# Patient Record
Sex: Female | Born: 1990 | Race: Black or African American | Hispanic: No | Marital: Single | Smoking: Never smoker
Health system: Southern US, Community
[De-identification: ages and names within clinical notes are randomized; demographics above are authoritative.]

## PROBLEM LIST (undated history)

## (undated) ENCOUNTER — Inpatient Hospital Stay (HOSPITAL_COMMUNITY): Payer: Self-pay

## (undated) DIAGNOSIS — I10 Essential (primary) hypertension: Secondary | ICD-10-CM

## (undated) DIAGNOSIS — J45909 Unspecified asthma, uncomplicated: Secondary | ICD-10-CM

## (undated) HISTORY — DX: Essential (primary) hypertension: I10

## (undated) HISTORY — PX: NO PAST SURGERIES: SHX2092

## (undated) HISTORY — DX: Unspecified asthma, uncomplicated: J45.909

---

## 2011-01-27 ENCOUNTER — Emergency Department (HOSPITAL_COMMUNITY)
Admission: EM | Admit: 2011-01-27 | Discharge: 2011-01-27 | Disposition: A | Discharge status: Home / Self Care | Payer: Self-pay | Attending: Emergency Medicine | Admitting: Emergency Medicine

## 2011-01-27 DIAGNOSIS — K089 Disorder of teeth and supporting structures, unspecified: Secondary | ICD-10-CM | POA: Insufficient documentation

## 2011-01-27 DIAGNOSIS — K029 Dental caries, unspecified: Secondary | ICD-10-CM | POA: Insufficient documentation

## 2011-01-28 ENCOUNTER — Emergency Department (HOSPITAL_COMMUNITY)
Admission: EM | Admit: 2011-01-28 | Discharge: 2011-01-28 | Disposition: A | Discharge status: Home / Self Care | Payer: Self-pay | Attending: Emergency Medicine | Admitting: Emergency Medicine

## 2011-01-28 DIAGNOSIS — Y92009 Unspecified place in unspecified non-institutional (private) residence as the place of occurrence of the external cause: Secondary | ICD-10-CM | POA: Insufficient documentation

## 2011-01-28 DIAGNOSIS — M79609 Pain in unspecified limb: Secondary | ICD-10-CM | POA: Insufficient documentation

## 2011-01-28 DIAGNOSIS — S61409A Unspecified open wound of unspecified hand, initial encounter: Secondary | ICD-10-CM | POA: Insufficient documentation

## 2013-04-25 ENCOUNTER — Ambulatory Visit (INDEPENDENT_AMBULATORY_CARE_PROVIDER_SITE_OTHER): Payer: Medicaid Other

## 2013-04-25 ENCOUNTER — Ambulatory Visit (INDEPENDENT_AMBULATORY_CARE_PROVIDER_SITE_OTHER): Payer: Medicaid Other | Admitting: Obstetrics

## 2013-04-25 ENCOUNTER — Other Ambulatory Visit: Payer: Self-pay | Admitting: Obstetrics

## 2013-04-25 ENCOUNTER — Encounter: Payer: Self-pay | Admitting: Obstetrics

## 2013-04-25 VITALS — BP 128/82 | Temp 98.6°F | Wt 229.6 lb

## 2013-04-25 DIAGNOSIS — O30009 Twin pregnancy, unspecified number of placenta and unspecified number of amniotic sacs, unspecified trimester: Secondary | ICD-10-CM

## 2013-04-25 DIAGNOSIS — Z3400 Encounter for supervision of normal first pregnancy, unspecified trimester: Secondary | ICD-10-CM

## 2013-04-25 DIAGNOSIS — Z3402 Encounter for supervision of normal first pregnancy, second trimester: Secondary | ICD-10-CM

## 2013-04-25 DIAGNOSIS — Z369 Encounter for antenatal screening, unspecified: Secondary | ICD-10-CM

## 2013-04-25 DIAGNOSIS — O30002 Twin pregnancy, unspecified number of placenta and unspecified number of amniotic sacs, second trimester: Secondary | ICD-10-CM

## 2013-04-25 DIAGNOSIS — IMO0001 Reserved for inherently not codable concepts without codable children: Secondary | ICD-10-CM

## 2013-04-25 DIAGNOSIS — Z3689 Encounter for other specified antenatal screening: Secondary | ICD-10-CM

## 2013-04-25 DIAGNOSIS — O0990 Supervision of high risk pregnancy, unspecified, unspecified trimester: Secondary | ICD-10-CM

## 2013-04-25 DIAGNOSIS — Z3201 Encounter for pregnancy test, result positive: Secondary | ICD-10-CM

## 2013-04-25 DIAGNOSIS — Z1389 Encounter for screening for other disorder: Secondary | ICD-10-CM

## 2013-04-25 LAB — US OB DETAIL + 14 WK

## 2013-04-25 LAB — POCT URINALYSIS DIPSTICK
Bilirubin, UA: NEGATIVE
Glucose, UA: NEGATIVE
Ketones, UA: NEGATIVE
Leukocytes, UA: NEGATIVE
Nitrite, UA: NEGATIVE

## 2013-04-25 NOTE — Progress Notes (Signed)
P 108 Subjective:    Monique [name] is being seen today for her first obstetrical visit.  This is not a planned pregnancy. She is at [redacted]w[redacted]d gestation. Her obstetrical history is significant for obesity. Relationship with FOB: significant other, not living together. Patient unsure intend to breast feed. Pregnancy history fully reviewed.  Menstrual History: OB History   Grav Para Term Preterm Abortions TAB SAB Ect Mult Living   2    1           Menarche age: 22 Patient's last menstrual period was 11/21/2012.    The following portions of the patient's history were reviewed and updated as appropriate: allergies, current medications, past family history, past medical history, past social history, past surgical history and problem list.  Review of Systems Pertinent items are noted in HPI.    Objective:    General appearance: alert and no distress Abdomen: normal findings: soft, non-tender Pelvic: cervix normal in appearance, external genitalia normal, no adnexal masses or tenderness, no cervical motion tenderness, vagina normal without discharge and uterus enlarged, soft NT. Extremities: extremities normal, atraumatic, no cyanosis or edema    Assessment:    Pregnancy at 24 weeks.  Twins.   Plan:    Initial labs drawn. Prenatal vitamins.  Counseling provided regarding continued use of seat belts, cessation of alcohol consumption, smoking or use of illicit drugs; infection precautions i.e., influenza/TDAP immunizations, toxoplasmosis,CMV, parvovirus, listeria and varicella; workplace safety, exercise during pregnancy; routine dental care, safe medications, sexual activity, hot tubs, saunas, pools, travel, caffeine use, fish and methlymercury, potential toxins, hair treatments, varicose veins Weight gain recommendations reviewed: underweight/BMI< 18.5--> gain 28 - 40 lbs; normal weight/BMI 18.5 - 24.9--> gain 25 - 35 lbs; overweight/BMI 25 - 29.9--> gain 15 - 25 lbs; obese/BMI  >30->gain  11 - 20 lbs Problem list reviewed and updated. AFP3 discussed: requested. Role of ultrasound in pregnancy discussed; fetal survey: requested. Amniocentesis discussed: not indicated. Follow up in 2 weeks. 50% of 20 min visit spent on counseling and coordination of care.

## 2013-04-26 LAB — WET PREP BY MOLECULAR PROBE
Gardnerella vaginalis: NEGATIVE
Trichomonas vaginosis: NEGATIVE

## 2013-04-26 LAB — OBSTETRIC PANEL
Antibody Screen: NEGATIVE
Basophils Relative: 0 % (ref 0–1)
Eosinophils Absolute: 0 10*3/uL (ref 0.0–0.7)
Eosinophils Relative: 0 % (ref 0–5)
HCT: 35.2 % — ABNORMAL LOW (ref 36.0–46.0)
Hemoglobin: 11.9 g/dL — ABNORMAL LOW (ref 12.0–15.0)
Hepatitis B Surface Ag: NEGATIVE
MCH: 31.4 pg (ref 26.0–34.0)
MCHC: 33.8 g/dL (ref 30.0–36.0)
MCV: 92.9 fL (ref 78.0–100.0)
Monocytes Relative: 7 % (ref 3–12)
Neutrophils Relative %: 77 % (ref 43–77)
Platelets: 268 10*3/uL (ref 150–400)
RBC: 3.79 MIL/uL — ABNORMAL LOW (ref 3.87–5.11)
Rh Type: POSITIVE

## 2013-04-26 LAB — HIV ANTIBODY (ROUTINE TESTING W REFLEX): HIV: NONREACTIVE

## 2013-04-26 LAB — GC/CHLAMYDIA PROBE AMP: CT Probe RNA: NEGATIVE

## 2013-04-27 LAB — HEMOGLOBINOPATHY EVALUATION
Hemoglobin Other: 0 %
Hgb A2 Quant: 3 % (ref 2.2–3.2)
Hgb A: 96.8 % (ref 96.8–97.8)
Hgb F Quant: 0.2 % (ref 0.0–2.0)

## 2013-04-27 LAB — PAP IG, CT-NG, RFX HPV ASCU

## 2013-04-28 LAB — CULTURE, OB URINE

## 2013-05-09 ENCOUNTER — Ambulatory Visit (HOSPITAL_COMMUNITY)
Admission: RE | Admit: 2013-05-09 | Discharge: 2013-05-09 | Disposition: A | Discharge status: Home / Self Care | Payer: Medicaid Other | Source: Ambulatory Visit | Attending: Obstetrics | Admitting: Obstetrics

## 2013-05-09 ENCOUNTER — Encounter: Payer: Medicaid Other | Admitting: Obstetrics

## 2013-05-09 ENCOUNTER — Encounter: Payer: Self-pay | Admitting: Obstetrics

## 2013-05-09 ENCOUNTER — Ambulatory Visit (INDEPENDENT_AMBULATORY_CARE_PROVIDER_SITE_OTHER): Payer: Medicaid Other | Admitting: Obstetrics

## 2013-05-09 VITALS — BP 115/77 | Temp 98.1°F | Wt 231.0 lb

## 2013-05-09 DIAGNOSIS — Z1389 Encounter for screening for other disorder: Secondary | ICD-10-CM | POA: Insufficient documentation

## 2013-05-09 DIAGNOSIS — Z3400 Encounter for supervision of normal first pregnancy, unspecified trimester: Secondary | ICD-10-CM

## 2013-05-09 DIAGNOSIS — O358XX0 Maternal care for other (suspected) fetal abnormality and damage, not applicable or unspecified: Secondary | ICD-10-CM | POA: Insufficient documentation

## 2013-05-09 DIAGNOSIS — IMO0001 Reserved for inherently not codable concepts without codable children: Secondary | ICD-10-CM

## 2013-05-09 DIAGNOSIS — O0990 Supervision of high risk pregnancy, unspecified, unspecified trimester: Secondary | ICD-10-CM

## 2013-05-09 DIAGNOSIS — Z3401 Encounter for supervision of normal first pregnancy, first trimester: Secondary | ICD-10-CM

## 2013-05-09 DIAGNOSIS — Z363 Encounter for antenatal screening for malformations: Secondary | ICD-10-CM | POA: Insufficient documentation

## 2013-05-09 DIAGNOSIS — O30009 Twin pregnancy, unspecified number of placenta and unspecified number of amniotic sacs, unspecified trimester: Secondary | ICD-10-CM

## 2013-05-09 LAB — POCT URINALYSIS DIPSTICK
Bilirubin, UA: NEGATIVE
Ketones, UA: NEGATIVE
Nitrite, UA: NEGATIVE
Protein, UA: NEGATIVE
Spec Grav, UA: 1.015
pH, UA: 7

## 2013-05-09 MED ORDER — FOLIC ACID 1 MG PO TABS: 1.0000 mg | ORAL_TABLET | Freq: Every day | ORAL | Status: DC

## 2013-05-09 MED ORDER — VITAFOL-ONE 29-1-200 MG PO CAPS: 1.0000 | ORAL_CAPSULE | Freq: Every day | ORAL | Status: DC

## 2013-05-09 NOTE — Progress Notes (Signed)
HR - 82 Pt in office for routine OB visit, want to know if it is safe to sleep on her stomach.

## 2013-05-09 NOTE — Consult Note (Signed)
MFM Staff Consultation Note  By way of consultation, I briefly explained the biology of twinning, both monozygotic and dizygotic.  I described the sequence of development of the chorionic and amniotic membranes, and the clinical significance of chorionicity. I spoke to [name] about the risks and management of twin pregnancy.  I explained the risk of preterm labor and delivery, with the mean gestational age at delivery being around 35 weeks.  I also outlined the increased risk of abnormal fetal growth, especially IUGR, and especially in the third trimester.  I reviewed the increased risks of preeclampsia, gestational diabetes, and maternal anemia.  Because she has a dichorionic pregnancy, I did not discuss the risk of twin-twin transfusion syndrome as it does not apply.  I outlined the standard management plan for twin pregnancy with Ms.  I told her that she would likely be seen every two weeks in the office, with cervical exams only as indicated.  I also recommended repeat ultrasound exams every three to four weeks to plot fetal growth, position, and amniotic fluid volume.  Beginning at around 32-34 weeks, she should have twice weekly non-stress tests in addition to following fetal movement on a daily basis through the 3rd trimester. Because of the increased risk of GDM, an early glucola screen should be considered.  If normal, the test should be repeated at around 28 weeks.  At each of her office visits through the third trimester, the usual careful attention should be paid to blood pressure, proteinuria, weight gain, etc., as a screen for pre-eclampsia.  Assuming all goes well, she should plan for delivery by 38 weeks should spontaneous labor not occur in the interim.  Summary of Recommendations: 1. Interval growth monthly by ultrasound; 2. Initiation of antenatal testing around 32-34 weeks 3. Consideration of early 1 hour glucose tolerance test at earliest convenience.  If negative,  repeat at 28 weeks. 4. Assessment of cervical length by endovaginal imaging at around 24 weeks (concurrent with routinely scheduled growth scan) 5. Assuming all goes well, she should plan for delivery by 38 weeks should spontaneous labor not occur in the interim.   Time Spent:  I spent in excess of 30 minutes in consultation with this patient to review records, evaluate her case, and provide her with an adequate discussion and education.  More than 50% of this time was spent in direct face-to-face counseling. It was a pleasure seeing your patient in the office today.  Thank you for consultation. Please do not hesitate to contact our service for any further questions.   Thank you,  Monique [name], Monique Sjogren, MD, MS, FACOG Assistant Professor Section of Maternal-Fetal Medicine [facility]

## 2013-05-10 ENCOUNTER — Encounter: Payer: Medicaid Other | Admitting: Obstetrics

## 2013-05-25 ENCOUNTER — Inpatient Hospital Stay (HOSPITAL_COMMUNITY)
Admission: AD | Admit: 2013-05-25 | Discharge: 2013-05-26 | Disposition: A | Discharge status: Home / Self Care | Payer: Medicaid Other | Source: Ambulatory Visit | Attending: Obstetrics | Admitting: Obstetrics

## 2013-05-25 ENCOUNTER — Encounter: Payer: Medicaid Other | Admitting: Obstetrics

## 2013-05-25 ENCOUNTER — Encounter (HOSPITAL_COMMUNITY): Payer: Self-pay | Admitting: *Deleted

## 2013-05-25 DIAGNOSIS — B373 Candidiasis of vulva and vagina: Secondary | ICD-10-CM

## 2013-05-25 DIAGNOSIS — O4700 False labor before 37 completed weeks of gestation, unspecified trimester: Secondary | ICD-10-CM | POA: Insufficient documentation

## 2013-05-25 DIAGNOSIS — N949 Unspecified condition associated with female genital organs and menstrual cycle: Secondary | ICD-10-CM | POA: Insufficient documentation

## 2013-05-25 DIAGNOSIS — B3731 Acute candidiasis of vulva and vagina: Secondary | ICD-10-CM | POA: Insufficient documentation

## 2013-05-25 DIAGNOSIS — O0990 Supervision of high risk pregnancy, unspecified, unspecified trimester: Secondary | ICD-10-CM | POA: Insufficient documentation

## 2013-05-25 DIAGNOSIS — O2390 Unspecified genitourinary tract infection in pregnancy, unspecified trimester: Secondary | ICD-10-CM | POA: Insufficient documentation

## 2013-05-25 DIAGNOSIS — Z87891 Personal history of nicotine dependence: Secondary | ICD-10-CM | POA: Insufficient documentation

## 2013-05-25 LAB — OB RESULTS CONSOLE GC/CHLAMYDIA
CHLAMYDIA, DNA PROBE: NEGATIVE
Gonorrhea: NEGATIVE

## 2013-05-25 NOTE — MAU Note (Signed)
PT SAYS  SHE STARTED FEELING PAIN IN HER VAGINA- HURTS WHEN SHE PUTS HER PANTS ON.   SAW DR HARPER- 1.5 WK AGO. - ALL OK .  SHE CALLED [facility]- TOLD HER TO CALL OFFICE- NO ANSWER  ON PHONE.Marland Kitchen  NEXT APPOINTMENT- 2 WEEKS.    LAST SEX-MON OR Tuesday -  NO PAIN- BUT DID FEEL WEIRD  TO VOID.

## 2013-05-26 DIAGNOSIS — B373 Candidiasis of vulva and vagina: Secondary | ICD-10-CM

## 2013-05-26 LAB — GC/CHLAMYDIA PROBE AMP
CT Probe RNA: NEGATIVE
GC Probe RNA: NEGATIVE

## 2013-05-26 LAB — URINE MICROSCOPIC-ADD ON

## 2013-05-26 LAB — URINALYSIS, ROUTINE W REFLEX MICROSCOPIC
Bilirubin Urine: NEGATIVE
Ketones, ur: NEGATIVE mg/dL
Nitrite: NEGATIVE
Specific Gravity, Urine: 1.02 (ref 1.005–1.030)
Urobilinogen, UA: 1 mg/dL (ref 0.0–1.0)
pH: 7 (ref 5.0–8.0)

## 2013-05-26 LAB — WET PREP, GENITAL: Trich, Wet Prep: NONE SEEN

## 2013-05-26 MED ORDER — FLUCONAZOLE 150 MG PO TABS
150.0000 mg | ORAL_TABLET | Freq: Once | ORAL | Status: AC
  Administered 2013-05-26: 150 mg via ORAL
  Filled 2013-05-26: qty 1

## 2013-05-26 NOTE — MAU Provider Note (Signed)
History     CSN: [id number]  Arrival date and time: 05/25/13 2303   First Provider Initiated Contact with Patient 05/26/13 0007      No chief complaint on file.  HPI  Monique [name] is a 22 y.o. G2P0010 at 102w1d with twins. She presents tonight with pelvic pain. She states that when she is resting or sitting she has no pain, but when she puts her pants on she feels "really bad" pain in her pelvis. She denies any UCs, VB or LOF and confirms both fetuses are moving. She denies any problems with this pregnancy. She has an appointment with Dr. Clearance Coots next week.   Past Medical History  Diagnosis Date  . Asthma     History reviewed. No pertinent past surgical history.  Family History  Problem Relation Age of Onset  . Hypertension Mother   . Diabetes Mother   . Hypertension Father   . Diabetes Father     History  Substance Use Topics  . Smoking status: Former Smoker    Types: Cigars  . Smokeless tobacco: Not on file  . Alcohol Use: No    Allergies: No Known Allergies  Prescriptions prior to admission  Medication Sig Dispense Refill  . Prenatal Vit-FePoly-FA-DHA (VITAFOL-ONE) 29-1-200 MG CAPS Take 1 capsule by mouth daily before breakfast.  90 capsule  3  . folic acid (FOLVITE) 1 MG tablet Take 1 tablet (1 mg total) by mouth daily.  90 tablet  3  . prenatal vitamin w/FE, FA (PRENATAL 1 + 1) 27-1 MG TABS tablet Take 1 tablet by mouth daily at 12 noon.        ROS Physical Exam   Blood pressure 137/74, pulse 89, temperature 98.1 F (36.7 C), temperature source Oral, resp. rate 20, height 5\' 3"  (1.6 m), weight 109.43 kg (241 lb 4 oz), last menstrual period 11/21/2012.  Physical Exam  Nursing note and vitals reviewed. Constitutional: She is oriented to person, place, and time. She appears well-developed and well-nourished. No distress.  Cardiovascular: Normal rate.   Respiratory: Effort normal.  GI: Soft. There is no tenderness.  Genitourinary:   .External: no  lesion Vagina: small amount of thick white discharge Cervix: pink, smooth, closed/thick/high Uterus: AGA    Neurological: She is alert and oriented to person, place, and time.  Skin: Skin is warm and dry.  Psychiatric: She has a normal mood and affect.   FHT: A: 140, moderate with 10x10 accels no decels B: 145, moderate with 10x10 accels, no decels Appropriate for GA Toco: No UCs MAU Course  Procedures  Results for orders placed during the hospital encounter of 05/25/13 (from the past 24 hour(s))  URINALYSIS, ROUTINE W REFLEX MICROSCOPIC     Status: Abnormal   Collection Time    05/25/13 11:12 PM      Result Value Range   Color, Urine YELLOW  YELLOW   APPearance HAZY (*) CLEAR   Specific Gravity, Urine 1.020  1.005 - 1.030   pH 7.0  5.0 - 8.0   Glucose, UA NEGATIVE  NEGATIVE mg/dL   Hgb urine dipstick TRACE (*) NEGATIVE   Bilirubin Urine NEGATIVE  NEGATIVE   Ketones, ur NEGATIVE  NEGATIVE mg/dL   Protein, ur NEGATIVE  NEGATIVE mg/dL   Urobilinogen, UA 1.0  0.0 - 1.0 mg/dL   Nitrite NEGATIVE  NEGATIVE   Leukocytes, UA TRACE (*) NEGATIVE  URINE MICROSCOPIC-ADD ON     Status: None   Collection Time    05/25/13 11:12 PM  Result Value Range   Squamous Epithelial / LPF RARE  RARE   WBC, UA 0-2  <3 WBC/hpf   RBC / HPF 0-2  <3 RBC/hpf   Bacteria, UA RARE  RARE   Urine-Other YEAST    WET PREP, GENITAL     Status: Abnormal   Collection Time    05/25/13 11:55 PM      Result Value Range   Yeast Wet Prep HPF POC FEW (*) NONE SEEN   Trich, Wet Prep NONE SEEN  NONE SEEN   Clue Cells Wet Prep HPF POC MODERATE (*) NONE SEEN   WBC, Wet Prep HPF POC MODERATE (*) NONE SEEN     Assessment and Plan   1. Yeast infection involving the vagina and surrounding area   2. 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 Unspecified high-risk pregnancy    PTL precautions Comfort measures for pubic bone pain reviewed Maternity support belt  recommended  Follow-up Information   Follow up with HARPER,CHARLES A, MD. (as planned )    Specialty:  Obstetrics and Gynecology   Contact information:   [address] [place] [address] [phone]        Tawnya Crook 05/26/2013, 12:15 AM

## 2013-06-05 ENCOUNTER — Other Ambulatory Visit: Payer: Self-pay | Admitting: Obstetrics

## 2013-06-05 DIAGNOSIS — O30009 Twin pregnancy, unspecified number of placenta and unspecified number of amniotic sacs, unspecified trimester: Secondary | ICD-10-CM

## 2013-06-06 ENCOUNTER — Ambulatory Visit (HOSPITAL_COMMUNITY)
Admission: RE | Admit: 2013-06-06 | Discharge: 2013-06-06 | Disposition: A | Discharge status: Home / Self Care | Payer: Medicaid Other | Source: Ambulatory Visit | Attending: Obstetrics | Admitting: Obstetrics

## 2013-06-15 ENCOUNTER — Ambulatory Visit (HOSPITAL_COMMUNITY)
Admission: RE | Admit: 2013-06-15 | Discharge: 2013-06-15 | Disposition: A | Discharge status: Home / Self Care | Payer: Medicaid Other | Source: Ambulatory Visit | Attending: Obstetrics | Admitting: Obstetrics

## 2013-06-15 ENCOUNTER — Other Ambulatory Visit: Payer: Medicaid Other

## 2013-06-15 ENCOUNTER — Ambulatory Visit (INDEPENDENT_AMBULATORY_CARE_PROVIDER_SITE_OTHER): Payer: Medicaid Other | Admitting: Obstetrics

## 2013-06-15 VITALS — BP 125/77 | Temp 97.8°F | Wt 249.0 lb

## 2013-06-15 DIAGNOSIS — O30009 Twin pregnancy, unspecified number of placenta and unspecified number of amniotic sacs, unspecified trimester: Secondary | ICD-10-CM

## 2013-06-15 DIAGNOSIS — Z3480 Encounter for supervision of other normal pregnancy, unspecified trimester: Secondary | ICD-10-CM

## 2013-06-15 DIAGNOSIS — Z3482 Encounter for supervision of other normal pregnancy, second trimester: Secondary | ICD-10-CM

## 2013-06-15 DIAGNOSIS — O30049 Twin pregnancy, dichorionic/diamniotic, unspecified trimester: Secondary | ICD-10-CM | POA: Insufficient documentation

## 2013-06-15 DIAGNOSIS — O3660X0 Maternal care for excessive fetal growth, unspecified trimester, not applicable or unspecified: Secondary | ICD-10-CM | POA: Insufficient documentation

## 2013-06-15 LAB — POCT URINALYSIS DIPSTICK
Bilirubin, UA: NEGATIVE
Blood, UA: NEGATIVE
Spec Grav, UA: 1.01
Urobilinogen, UA: NEGATIVE
pH, UA: 7

## 2013-06-15 NOTE — Progress Notes (Signed)
Pulse- 87 

## 2013-06-16 ENCOUNTER — Encounter: Payer: Self-pay | Admitting: Obstetrics

## 2013-06-19 ENCOUNTER — Ambulatory Visit (INDEPENDENT_AMBULATORY_CARE_PROVIDER_SITE_OTHER): Payer: Medicaid Other | Admitting: Obstetrics

## 2013-06-19 ENCOUNTER — Other Ambulatory Visit: Payer: Medicaid Other

## 2013-06-19 ENCOUNTER — Encounter: Payer: Self-pay | Admitting: Obstetrics

## 2013-06-19 VITALS — BP 149/84 | Temp 97.9°F | Wt 243.0 lb

## 2013-06-19 DIAGNOSIS — Z3402 Encounter for supervision of normal first pregnancy, second trimester: Secondary | ICD-10-CM

## 2013-06-19 DIAGNOSIS — O30009 Twin pregnancy, unspecified number of placenta and unspecified number of amniotic sacs, unspecified trimester: Secondary | ICD-10-CM

## 2013-06-19 DIAGNOSIS — O30002 Twin pregnancy, unspecified number of placenta and unspecified number of amniotic sacs, second trimester: Secondary | ICD-10-CM

## 2013-06-19 LAB — POCT URINALYSIS DIPSTICK
Blood, UA: NEGATIVE
Protein, UA: NEGATIVE
Spec Grav, UA: 1.01
pH, UA: 7

## 2013-06-19 LAB — CBC
HCT: 32.3 % — ABNORMAL LOW (ref 36.0–46.0)
Hemoglobin: 11 g/dL — ABNORMAL LOW (ref 12.0–15.0)
MCV: 86.1 fL (ref 78.0–100.0)
Platelets: 258 10*3/uL (ref 150–400)
RBC: 3.75 MIL/uL — ABNORMAL LOW (ref 3.87–5.11)
WBC: 8.4 10*3/uL (ref 4.0–10.5)

## 2013-06-19 NOTE — Progress Notes (Signed)
Pulse- 101 

## 2013-06-20 ENCOUNTER — Encounter: Payer: Self-pay | Admitting: Obstetrics

## 2013-06-20 LAB — HIV ANTIBODY (ROUTINE TESTING W REFLEX): HIV: NONREACTIVE

## 2013-06-20 LAB — GLUCOSE TOLERANCE, 2 HOURS W/ 1HR: Glucose, Fasting: 63 mg/dL — ABNORMAL LOW (ref 70–99)

## 2013-07-04 ENCOUNTER — Encounter: Payer: Medicaid Other | Admitting: Obstetrics

## 2013-07-05 ENCOUNTER — Other Ambulatory Visit: Payer: Self-pay | Admitting: Obstetrics

## 2013-07-05 DIAGNOSIS — O30009 Twin pregnancy, unspecified number of placenta and unspecified number of amniotic sacs, unspecified trimester: Secondary | ICD-10-CM

## 2013-07-07 ENCOUNTER — Other Ambulatory Visit: Payer: Self-pay | Admitting: Obstetrics

## 2013-07-07 ENCOUNTER — Encounter (HOSPITAL_COMMUNITY): Payer: Self-pay

## 2013-07-07 ENCOUNTER — Ambulatory Visit (HOSPITAL_COMMUNITY)
Admission: RE | Admit: 2013-07-07 | Discharge: 2013-07-07 | Disposition: A | Discharge status: Home / Self Care | Payer: Medicaid Other | Source: Ambulatory Visit | Attending: Obstetrics | Admitting: Obstetrics

## 2013-07-07 DIAGNOSIS — O30049 Twin pregnancy, dichorionic/diamniotic, unspecified trimester: Secondary | ICD-10-CM | POA: Insufficient documentation

## 2013-07-07 DIAGNOSIS — O99210 Obesity complicating pregnancy, unspecified trimester: Principal | ICD-10-CM

## 2013-07-07 DIAGNOSIS — O30009 Twin pregnancy, unspecified number of placenta and unspecified number of amniotic sacs, unspecified trimester: Secondary | ICD-10-CM

## 2013-07-07 DIAGNOSIS — E669 Obesity, unspecified: Secondary | ICD-10-CM | POA: Insufficient documentation

## 2013-07-10 ENCOUNTER — Ambulatory Visit (INDEPENDENT_AMBULATORY_CARE_PROVIDER_SITE_OTHER): Payer: Medicaid Other | Admitting: Obstetrics

## 2013-07-10 ENCOUNTER — Encounter: Payer: Self-pay | Admitting: Obstetrics

## 2013-07-10 VITALS — BP 136/79 | Temp 97.6°F | Wt 244.0 lb

## 2013-07-10 DIAGNOSIS — Z3403 Encounter for supervision of normal first pregnancy, third trimester: Secondary | ICD-10-CM

## 2013-07-10 DIAGNOSIS — Z3400 Encounter for supervision of normal first pregnancy, unspecified trimester: Secondary | ICD-10-CM

## 2013-07-10 LAB — POCT URINALYSIS DIPSTICK
BILIRUBIN UA: NEGATIVE
Blood, UA: NEGATIVE
GLUCOSE UA: NEGATIVE
Leukocytes, UA: NEGATIVE
NITRITE UA: NEGATIVE
PH UA: 7
Protein, UA: NEGATIVE
SPEC GRAV UA: 1.01
UROBILINOGEN UA: 1

## 2013-07-10 NOTE — Progress Notes (Signed)
P = 98 

## 2013-07-25 ENCOUNTER — Other Ambulatory Visit: Payer: Self-pay | Admitting: *Deleted

## 2013-07-25 DIAGNOSIS — O30009 Twin pregnancy, unspecified number of placenta and unspecified number of amniotic sacs, unspecified trimester: Secondary | ICD-10-CM

## 2013-07-25 MED ORDER — FOLIC ACID 1 MG PO TABS: 1.0000 mg | ORAL_TABLET | Freq: Every day | ORAL | Status: DC

## 2013-07-25 MED ORDER — VITAFOL-ONE 29-1-200 MG PO CAPS: 1.0000 | ORAL_CAPSULE | Freq: Every day | ORAL | Status: DC

## 2013-07-26 ENCOUNTER — Other Ambulatory Visit: Payer: Self-pay | Admitting: Obstetrics

## 2013-07-26 DIAGNOSIS — O30009 Twin pregnancy, unspecified number of placenta and unspecified number of amniotic sacs, unspecified trimester: Secondary | ICD-10-CM

## 2013-07-28 ENCOUNTER — Ambulatory Visit (HOSPITAL_COMMUNITY)
Admission: RE | Admit: 2013-07-28 | Discharge: 2013-07-28 | Disposition: A | Discharge status: Home / Self Care | Payer: Medicaid Other | Source: Ambulatory Visit | Attending: Advanced Practice Midwife | Admitting: Advanced Practice Midwife

## 2013-07-28 ENCOUNTER — Ambulatory Visit (INDEPENDENT_AMBULATORY_CARE_PROVIDER_SITE_OTHER): Payer: Medicaid Other | Admitting: Advanced Practice Midwife

## 2013-07-28 ENCOUNTER — Ambulatory Visit (HOSPITAL_COMMUNITY)
Admission: RE | Admit: 2013-07-28 | Discharge: 2013-07-28 | Disposition: A | Discharge status: Home / Self Care | Payer: Medicaid Other | Source: Ambulatory Visit | Attending: Obstetrics | Admitting: Obstetrics

## 2013-07-28 VITALS — BP 126/93 | Temp 97.7°F | Wt 247.0 lb

## 2013-07-28 DIAGNOSIS — O99891 Other specified diseases and conditions complicating pregnancy: Secondary | ICD-10-CM | POA: Insufficient documentation

## 2013-07-28 DIAGNOSIS — Z3480 Encounter for supervision of other normal pregnancy, unspecified trimester: Secondary | ICD-10-CM

## 2013-07-28 DIAGNOSIS — J45909 Unspecified asthma, uncomplicated: Secondary | ICD-10-CM | POA: Insufficient documentation

## 2013-07-28 DIAGNOSIS — O30009 Twin pregnancy, unspecified number of placenta and unspecified number of amniotic sacs, unspecified trimester: Secondary | ICD-10-CM | POA: Insufficient documentation

## 2013-07-28 DIAGNOSIS — O9989 Other specified diseases and conditions complicating pregnancy, childbirth and the puerperium: Secondary | ICD-10-CM

## 2013-07-28 DIAGNOSIS — O30049 Twin pregnancy, dichorionic/diamniotic, unspecified trimester: Secondary | ICD-10-CM | POA: Insufficient documentation

## 2013-07-28 DIAGNOSIS — E669 Obesity, unspecified: Secondary | ICD-10-CM | POA: Insufficient documentation

## 2013-07-28 DIAGNOSIS — O99210 Obesity complicating pregnancy, unspecified trimester: Secondary | ICD-10-CM

## 2013-07-28 LAB — POCT URINALYSIS DIPSTICK
Bilirubin, UA: NEGATIVE
GLUCOSE UA: NEGATIVE
Ketones, UA: NEGATIVE
Leukocytes, UA: NEGATIVE
Nitrite, UA: NEGATIVE
RBC UA: NEGATIVE
SPEC GRAV UA: 1.015
UROBILINOGEN UA: NEGATIVE
pH, UA: 6.5

## 2013-07-28 NOTE — Progress Notes (Signed)
Pulse: 91 Patient states she has so pelvic pressure [facility] and [facility] told her it was normal.

## 2013-07-28 NOTE — Progress Notes (Signed)
Subjective: Monique [name] is a 23 y.o. at 32 weeks  Patient denies vaginal leaking of fluid or bleeding, denies contractions.  Reports positive fetal movment.  Denies concerns today. Had earlier US today and NST. Reports everything went well. Denies HA, RUQ pain and vision change.  Objective: Filed Vitals:   07/28/13 1036  BP: 126/93  Temp: 97.7 F (36.5 C)   140 FHR A 146 FHR B Fetal movement.    Assessment: Patient Active Problem List   Diagnosis Date Noted  . Unspecified high-risk pregnancy 04/25/2013    Plan: Patient to return to clinic in 1 week RTC to see Dr. Clearance CootsHarper next week.  Establish monitoring after 32 weeks, may need NST if biweekly. Patient has US and NST scheduled next Friday. Monitor BP closely, unable to repeat BP today. Patient asymptomatic today w/ trace protein. Reviewed warning signs in pregnancy. Patient to call with concerns PRN. Reviewed triage location.  20 min spent with patient greater than 80% spent in counseling and coordination of care.   Jessa Stinson Wilson SingerWren CNM

## 2013-08-01 ENCOUNTER — Ambulatory Visit (HOSPITAL_COMMUNITY): Payer: Medicaid Other

## 2013-08-01 ENCOUNTER — Ambulatory Visit (INDEPENDENT_AMBULATORY_CARE_PROVIDER_SITE_OTHER): Payer: Medicaid Other | Admitting: Obstetrics

## 2013-08-01 ENCOUNTER — Encounter: Payer: Self-pay | Admitting: Obstetrics

## 2013-08-01 VITALS — BP 137/95 | Temp 97.7°F | Wt 252.0 lb

## 2013-08-01 DIAGNOSIS — O30009 Twin pregnancy, unspecified number of placenta and unspecified number of amniotic sacs, unspecified trimester: Secondary | ICD-10-CM

## 2013-08-01 DIAGNOSIS — Z3480 Encounter for supervision of other normal pregnancy, unspecified trimester: Secondary | ICD-10-CM

## 2013-08-01 LAB — POCT URINALYSIS DIPSTICK
Bilirubin, UA: NEGATIVE
Blood, UA: NEGATIVE
GLUCOSE UA: NEGATIVE
KETONES UA: NEGATIVE
LEUKOCYTES UA: NEGATIVE
Nitrite, UA: NEGATIVE
Protein, UA: NEGATIVE
Urobilinogen, UA: NEGATIVE
pH, UA: 7

## 2013-08-01 NOTE — Progress Notes (Signed)
Pulse: 89 Patient states there are no concerns.

## 2013-08-02 ENCOUNTER — Other Ambulatory Visit: Payer: Self-pay | Admitting: Obstetrics

## 2013-08-02 DIAGNOSIS — O30009 Twin pregnancy, unspecified number of placenta and unspecified number of amniotic sacs, unspecified trimester: Secondary | ICD-10-CM

## 2013-08-04 ENCOUNTER — Ambulatory Visit (HOSPITAL_COMMUNITY)
Admission: RE | Admit: 2013-08-04 | Discharge: 2013-08-04 | Disposition: A | Discharge status: Home / Self Care | Payer: Medicaid Other | Source: Ambulatory Visit | Attending: Advanced Practice Midwife | Admitting: Advanced Practice Midwife

## 2013-08-04 ENCOUNTER — Ambulatory Visit (HOSPITAL_COMMUNITY): Payer: Medicaid Other

## 2013-08-04 DIAGNOSIS — O30049 Twin pregnancy, dichorionic/diamniotic, unspecified trimester: Secondary | ICD-10-CM | POA: Insufficient documentation

## 2013-08-04 DIAGNOSIS — O30009 Twin pregnancy, unspecified number of placenta and unspecified number of amniotic sacs, unspecified trimester: Secondary | ICD-10-CM | POA: Insufficient documentation

## 2013-08-08 ENCOUNTER — Encounter: Payer: Self-pay | Admitting: Obstetrics

## 2013-08-08 ENCOUNTER — Ambulatory Visit (INDEPENDENT_AMBULATORY_CARE_PROVIDER_SITE_OTHER): Payer: Medicaid Other | Admitting: Obstetrics

## 2013-08-08 VITALS — BP 153/92 | Temp 97.9°F | Wt 252.0 lb

## 2013-08-08 DIAGNOSIS — Z3400 Encounter for supervision of normal first pregnancy, unspecified trimester: Secondary | ICD-10-CM

## 2013-08-08 LAB — POCT URINALYSIS DIPSTICK
Bilirubin, UA: NEGATIVE
Glucose, UA: NEGATIVE
Ketones, UA: NEGATIVE
Nitrite, UA: NEGATIVE
PH UA: 7
Spec Grav, UA: <=1.005
Urobilinogen, UA: NEGATIVE

## 2013-08-08 NOTE — Progress Notes (Signed)
Pulse: 86 Patient denies any concerns.

## 2013-08-12 ENCOUNTER — Encounter (HOSPITAL_COMMUNITY): Payer: Self-pay | Admitting: *Deleted

## 2013-08-12 ENCOUNTER — Inpatient Hospital Stay (HOSPITAL_COMMUNITY)
Admission: AD | Admit: 2013-08-12 | Discharge: 2013-08-12 | Disposition: A | Discharge status: Home / Self Care | Payer: Medicaid Other | Source: Ambulatory Visit | Attending: Obstetrics | Admitting: Obstetrics

## 2013-08-12 DIAGNOSIS — O99891 Other specified diseases and conditions complicating pregnancy: Secondary | ICD-10-CM | POA: Insufficient documentation

## 2013-08-12 DIAGNOSIS — O9989 Other specified diseases and conditions complicating pregnancy, childbirth and the puerperium: Principal | ICD-10-CM

## 2013-08-12 DIAGNOSIS — N949 Unspecified condition associated with female genital organs and menstrual cycle: Secondary | ICD-10-CM

## 2013-08-12 DIAGNOSIS — K219 Gastro-esophageal reflux disease without esophagitis: Secondary | ICD-10-CM

## 2013-08-12 LAB — URINE MICROSCOPIC-ADD ON

## 2013-08-12 LAB — URINALYSIS, ROUTINE W REFLEX MICROSCOPIC
BILIRUBIN URINE: NEGATIVE
Glucose, UA: NEGATIVE mg/dL
KETONES UR: 15 mg/dL — AB
Leukocytes, UA: NEGATIVE
NITRITE: NEGATIVE
Protein, ur: 100 mg/dL — AB
SPECIFIC GRAVITY, URINE: 1.01 (ref 1.005–1.030)
UROBILINOGEN UA: 0.2 mg/dL (ref 0.0–1.0)
pH: 6 (ref 5.0–8.0)

## 2013-08-12 MED ORDER — RANITIDINE HCL 150 MG PO TABS: 150.0000 mg | ORAL_TABLET | Freq: Two times a day (BID) | ORAL | Status: DC

## 2013-08-12 NOTE — MAU Provider Note (Signed)
Chief Complaint:  Abdominal Pain   First Provider Initiated Contact with Patient 08/12/13 2108      HPI: Monique [name] is a 23 y.o. G2P0010 at 817w3d who presents to maternity admissions reporting sharp bilateral inguinal pain which is intermittent, usually short, but sometimes lasting 30 minutes and tenderness at her umbilicus.  The lower abdominal pain has been occurring at night x3 nights and the abdominal tenderness x1-2 weeks.  She reports good fetal movement, denies LOF, vaginal bleeding, vaginal itching/burning, urinary symptoms, h/a, dizziness, n/v, or fever/chills.   Past Medical History: Past Medical History  Diagnosis Date  . Asthma     Past obstetric history: OB History  Gravida Para Term Preterm AB SAB TAB Ectopic Multiple Living  2    1     0    # Outcome Date GA Lbr Len/2nd Weight Sex Delivery Anes PTL Lv  2 CUR           1 ABT 09/26/12              Past Surgical History: Past Surgical History  Procedure Laterality Date  . No past surgeries      Family History: Family History  Problem Relation Age of Onset  . Hypertension Mother   . Diabetes Mother   . Hypertension Father   . Diabetes Father     Social History: History  Substance Use Topics  . Smoking status: Former Smoker    Types: Cigars  . Smokeless tobacco: Not on file  . Alcohol Use: No    Allergies: No Known Allergies  Meds:  Prescriptions prior to admission  Medication Sig Dispense Refill  . folic acid (FOLVITE) 1 MG tablet Take 1 tablet (1 mg total) by mouth daily.  90 tablet  3  . Prenatal Vit-FePoly-FA-DHA (VITAFOL-ONE) 29-1-200 MG CAPS Take 1 capsule by mouth daily before breakfast.  90 capsule  3    ROS: Pertinent findings in history of present illness.  Physical Exam  Blood pressure 136/70, pulse 85, temperature 98.7 F (37.1 C), temperature source Oral, resp. rate 18, height 5\' 3"  (1.6 m), weight 113.218 kg (249 lb 9.6 oz), last menstrual period 11/21/2012. GENERAL:  Well-developed, well-nourished female in no acute distress.  HEENT: normocephalic HEART: normal rate RESP: normal effort ABDOMEN: Soft, non-tender, gravid appropriate for gestational age EXTREMITIES: Nontender, no edema NEURO: alert and oriented  Dilation: Closed Effacement (%): Thick Cervical Position: Posterior Station: -3 Exam by:: L.Leftwich-Kirby,CNM  FHT Baby  A:  Baseline 145, moderate variability, accelerations present, no decelerations FHT Baby B:  Baseline 145 , moderate variability, accelerations present, no decelerations Contractions: None on toco or to palpation   Labs: Results for orders placed during the hospital encounter of 08/12/13 (from the past 24 hour(s))  URINALYSIS, ROUTINE W REFLEX MICROSCOPIC     Status: Abnormal   Collection Time    08/12/13  8:23 PM      Result Value Range   Color, Urine YELLOW  YELLOW   APPearance CLEAR  CLEAR   Specific Gravity, Urine 1.010  1.005 - 1.030   pH 6.0  5.0 - 8.0   Glucose, UA NEGATIVE  NEGATIVE mg/dL   Hgb urine dipstick MODERATE (*) NEGATIVE   Bilirubin Urine NEGATIVE  NEGATIVE   Ketones, ur 15 (*) NEGATIVE mg/dL   Protein, ur 161100 (*) NEGATIVE mg/dL   Urobilinogen, UA 0.2  0.0 - 1.0 mg/dL   Nitrite NEGATIVE  NEGATIVE   Leukocytes, UA NEGATIVE  NEGATIVE  URINE MICROSCOPIC-ADD  ON     Status: Abnormal   Collection Time    08/12/13  8:23 PM      Result Value Range   Squamous Epithelial / LPF MANY (*) RARE   WBC, UA 3-6  <3 WBC/hpf   RBC / HPF 0-2  <3 RBC/hpf   Bacteria, UA MANY (*) RARE    Assessment: 1. Round ligament pain     Plan: Consult Dr Tamela Oddi Discharge home Labor precautions and fetal kick counts Rest, warm bath, heat, Tylenol for round ligament pain Increase PO fluids Keep scheduled appointments in office Return to MAU as needed      Follow-up Information   Follow up with HARPER,CHARLES A, MD. (As scheduled. Return to MAU as needed.)    Specialty:  Obstetrics and Gynecology    Contact information:   [address] [place] [address] [phone]        Medication List         folic acid 1 MG tablet  Commonly known as:  FOLVITE  Take 1 tablet (1 mg total) by mouth daily.     VITAFOL-ONE 29-1-200 MG Caps  Take 1 capsule by mouth daily before breakfast.        Sharen Counter Certified Nurse-Midwife 08/12/2013 9:57 PM  Addendum:  At discharge, pt reported feeling heartburn frequently, causing her to vomit 2-4x/day.  She is not taking anything for heartburn currently.  Zantac 150 mg PO BID sent to pt pharmacy.

## 2013-08-12 NOTE — MAU Note (Signed)
Pt reports she has been having sharp abd pain in her lower abd off and on for the past 3 nights. Also c/o  umilical pain that hurts when it is touched or rubbed against (by clothing). Reports she has been vomiting for the past2 days only able to keep down liquids. Vomits food up (fries , cheese steak and gronola bar)

## 2013-08-12 NOTE — Discharge Instructions (Signed)
Reasons to return to MAU:  1.  Contractions are  5-10 minutes apart or less, each last 1 minute, and you cannot walk or talk during them 2.  You have a large gush of fluid, or a trickle of fluid that will not stop and you have to wear a pad 3.  You have bleeding that is bright red, heavier than spotting--like menstrual bleeding (spotting can be normal in early labor or after a check of your cervix) 4.  You do not feel the baby moving like he/she normally does  Abdominal Pain During Pregnancy Abdominal pain is common in pregnancy. Most of the time, it does not cause harm. There are many causes of abdominal pain. Some causes are more serious than others. Some of the causes of abdominal pain in pregnancy are easily diagnosed. Occasionally, the diagnosis takes time to understand. Other times, the cause is not determined. Abdominal pain can be a sign that something is very wrong with the pregnancy, or the pain may have nothing to do with the pregnancy at all. For this reason, always tell your health care provider if you have any abdominal discomfort. HOME CARE INSTRUCTIONS  Monitor your abdominal pain for any changes. The following actions may help to alleviate any discomfort you are experiencing:  Do not have sexual intercourse or put anything in your vagina until your symptoms go away completely.  Get plenty of rest until your pain improves.  Drink clear fluids if you feel nauseous. Avoid solid food as long as you are uncomfortable or nauseous.  Only take over-the-counter or prescription medicine as directed by your health care provider.  Keep all follow-up appointments with your health care provider. SEEK IMMEDIATE MEDICAL CARE IF:  You are bleeding, leaking fluid, or passing tissue from the vagina.  You have increasing pain or cramping.  You have persistent vomiting.  You have painful or bloody urination.  You have a fever.  You notice a decrease in your baby's movements.  You have  extreme weakness or feel faint.  You have shortness of breath, with or without abdominal pain.  You develop a severe headache with abdominal pain.  You have abnormal vaginal discharge with abdominal pain.  You have persistent diarrhea.  You have abdominal pain that continues even after rest, or gets worse. MAKE SURE YOU:   Understand these instructions.  Will watch your condition.  Will get help right away if you are not doing well or get worse. Document Released: 06/22/2005 Document Revised: 04/12/2013 Document Reviewed: 01/19/2013 [facility] Patient Information 2014 [place], [place].

## 2013-08-15 ENCOUNTER — Ambulatory Visit (INDEPENDENT_AMBULATORY_CARE_PROVIDER_SITE_OTHER): Payer: Medicaid Other | Admitting: Obstetrics

## 2013-08-15 ENCOUNTER — Encounter: Payer: Medicaid Other | Admitting: Obstetrics

## 2013-08-15 VITALS — BP 130/80 | Temp 97.9°F | Wt 252.0 lb

## 2013-08-15 DIAGNOSIS — O219 Vomiting of pregnancy, unspecified: Secondary | ICD-10-CM | POA: Insufficient documentation

## 2013-08-15 DIAGNOSIS — K219 Gastro-esophageal reflux disease without esophagitis: Secondary | ICD-10-CM

## 2013-08-15 DIAGNOSIS — O30009 Twin pregnancy, unspecified number of placenta and unspecified number of amniotic sacs, unspecified trimester: Secondary | ICD-10-CM

## 2013-08-15 DIAGNOSIS — Z3400 Encounter for supervision of normal first pregnancy, unspecified trimester: Secondary | ICD-10-CM

## 2013-08-15 LAB — POCT URINALYSIS DIPSTICK
Bilirubin, UA: NEGATIVE
Glucose, UA: NEGATIVE
Ketones, UA: NEGATIVE
Leukocytes, UA: NEGATIVE
NITRITE UA: NEGATIVE
PH UA: 7
PROTEIN UA: NEGATIVE
Spec Grav, UA: <=1.005
Urobilinogen, UA: NEGATIVE

## 2013-08-15 LAB — URINE CULTURE: Colony Count: 30000

## 2013-08-15 MED ORDER — OMEPRAZOLE 20 MG PO CPDR: 20.0000 mg | DELAYED_RELEASE_CAPSULE | Freq: Two times a day (BID) | ORAL | Status: DC

## 2013-08-15 MED ORDER — ONDANSETRON 8 MG PO TBDP: 8.0000 mg | ORAL_TABLET | Freq: Three times a day (TID) | ORAL | Status: DC | PRN

## 2013-08-15 NOTE — Progress Notes (Signed)
Pulse: 91 Patient states she went to the hospital Saturday for sharp pelvic pains and heart burn. Patient states doctor stated everything was fine, that cervix was still closed and that she wasn't having any contractions. Patient states she was prescribed zantac for the heart burn. Patient states she was vomiting before she came to visit. Patient states she would like to know why she cant keep any food down and should be something she should worry about.

## 2013-08-17 LAB — STREP B DNA PROBE: GBSP: NEGATIVE

## 2013-08-22 ENCOUNTER — Encounter: Payer: Medicaid Other | Admitting: Obstetrics

## 2013-08-23 ENCOUNTER — Encounter: Payer: Medicaid Other | Admitting: Obstetrics

## 2013-08-28 ENCOUNTER — Inpatient Hospital Stay (HOSPITAL_COMMUNITY)
Admission: AD | Admit: 2013-08-28 | Discharge: 2013-08-29 | Disposition: A | Discharge status: Home / Self Care | Payer: Medicaid Other | Source: Ambulatory Visit | Attending: Obstetrics | Admitting: Obstetrics

## 2013-08-28 ENCOUNTER — Encounter (HOSPITAL_COMMUNITY): Payer: Self-pay | Admitting: *Deleted

## 2013-08-28 DIAGNOSIS — O4700 False labor before 37 completed weeks of gestation, unspecified trimester: Secondary | ICD-10-CM | POA: Insufficient documentation

## 2013-08-28 DIAGNOSIS — O30009 Twin pregnancy, unspecified number of placenta and unspecified number of amniotic sacs, unspecified trimester: Secondary | ICD-10-CM | POA: Insufficient documentation

## 2013-08-28 NOTE — MAU Note (Signed)
SAYS   UC STARTED HURTING BAD AT 7 PM.    NO  VE IN OFFICE-   HAD TO CANCEL LAST  APPOINTMENT - BECAUSE OF SNOW-  HAD PLANNED TO RESCH TOMORROW.   WAS IN MAU  2 WEEKS AGO -  VE - CLOSED  SAYS  SHE FEELS SOMETHING  FLOWING BUT SEES NOTHING- .   DENIES HSV AND MRSA.

## 2013-08-29 ENCOUNTER — Other Ambulatory Visit: Payer: Self-pay | Admitting: Obstetrics

## 2013-08-29 DIAGNOSIS — O30009 Twin pregnancy, unspecified number of placenta and unspecified number of amniotic sacs, unspecified trimester: Secondary | ICD-10-CM

## 2013-08-29 MED ORDER — ZOLPIDEM TARTRATE 5 MG PO TABS: 5.0000 mg | ORAL_TABLET | Freq: Once | ORAL | Status: DC

## 2013-08-30 ENCOUNTER — Encounter (HOSPITAL_COMMUNITY): Payer: Self-pay | Admitting: *Deleted

## 2013-08-30 ENCOUNTER — Encounter: Payer: Self-pay | Admitting: Obstetrics

## 2013-08-30 ENCOUNTER — Inpatient Hospital Stay (HOSPITAL_COMMUNITY)
Admission: AD | Admit: 2013-08-30 | Discharge: 2013-09-04 | DRG: 765 | Disposition: A | Discharge status: Home / Self Care | Payer: Medicaid Other | Source: Ambulatory Visit | Attending: Obstetrics & Gynecology | Admitting: Obstetrics & Gynecology

## 2013-08-30 ENCOUNTER — Inpatient Hospital Stay (HOSPITAL_COMMUNITY): Payer: Medicaid Other

## 2013-08-30 ENCOUNTER — Ambulatory Visit (INDEPENDENT_AMBULATORY_CARE_PROVIDER_SITE_OTHER): Payer: Medicaid Other | Admitting: Obstetrics

## 2013-08-30 VITALS — BP 154/93 | Wt 253.0 lb

## 2013-08-30 DIAGNOSIS — O30049 Twin pregnancy, dichorionic/diamniotic, unspecified trimester: Secondary | ICD-10-CM

## 2013-08-30 DIAGNOSIS — O1410 Severe pre-eclampsia, unspecified trimester: Secondary | ICD-10-CM | POA: Diagnosis present

## 2013-08-30 DIAGNOSIS — O30009 Twin pregnancy, unspecified number of placenta and unspecified number of amniotic sacs, unspecified trimester: Secondary | ICD-10-CM

## 2013-08-30 DIAGNOSIS — D649 Anemia, unspecified: Secondary | ICD-10-CM | POA: Diagnosis not present

## 2013-08-30 DIAGNOSIS — O9903 Anemia complicating the puerperium: Secondary | ICD-10-CM | POA: Diagnosis not present

## 2013-08-30 DIAGNOSIS — Z3400 Encounter for supervision of normal first pregnancy, unspecified trimester: Secondary | ICD-10-CM

## 2013-08-30 DIAGNOSIS — Z87891 Personal history of nicotine dependence: Secondary | ICD-10-CM

## 2013-08-30 DIAGNOSIS — K219 Gastro-esophageal reflux disease without esophagitis: Secondary | ICD-10-CM | POA: Diagnosis present

## 2013-08-30 DIAGNOSIS — O1490 Unspecified pre-eclampsia, unspecified trimester: Secondary | ICD-10-CM

## 2013-08-30 DIAGNOSIS — O1414 Severe pre-eclampsia complicating childbirth: Principal | ICD-10-CM | POA: Diagnosis present

## 2013-08-30 DIAGNOSIS — IMO0002 Reserved for concepts with insufficient information to code with codable children: Secondary | ICD-10-CM

## 2013-08-30 LAB — COMPREHENSIVE METABOLIC PANEL
ALT: 27 U/L (ref 0–35)
AST: 62 U/L — ABNORMAL HIGH (ref 0–37)
Albumin: 2.3 g/dL — ABNORMAL LOW (ref 3.5–5.2)
Alkaline Phosphatase: 167 U/L — ABNORMAL HIGH (ref 39–117)
BILIRUBIN TOTAL: 0.5 mg/dL (ref 0.3–1.2)
BUN: 3 mg/dL — AB (ref 6–23)
CHLORIDE: 101 meq/L (ref 96–112)
CO2: 23 mEq/L (ref 19–32)
CREATININE: 0.56 mg/dL (ref 0.50–1.10)
Calcium: 8.3 mg/dL — ABNORMAL LOW (ref 8.4–10.5)
GFR calc non Af Amer: >90 mL/min (ref >90)
Glucose, Bld: 93 mg/dL (ref 70–99)
Potassium: 3 mEq/L — ABNORMAL LOW (ref 3.7–5.3)
Sodium: 138 mEq/L (ref 137–147)
Total Protein: 6.5 g/dL (ref 6.0–8.3)

## 2013-08-30 LAB — URINALYSIS, ROUTINE W REFLEX MICROSCOPIC
Bilirubin Urine: NEGATIVE
Glucose, UA: NEGATIVE mg/dL
Ketones, ur: NEGATIVE mg/dL
LEUKOCYTES UA: NEGATIVE
NITRITE: NEGATIVE
PROTEIN: 100 mg/dL — AB
Specific Gravity, Urine: 1.015 (ref 1.005–1.030)
Urobilinogen, UA: 4 mg/dL — ABNORMAL HIGH (ref 0.0–1.0)
pH: 7.5 (ref 5.0–8.0)

## 2013-08-30 LAB — CBC
HCT: 30.8 % — ABNORMAL LOW (ref 36.0–46.0)
HEMATOCRIT: 30.6 % — AB (ref 36.0–46.0)
Hemoglobin: 9.9 g/dL — ABNORMAL LOW (ref 12.0–15.0)
Hemoglobin: 10 g/dL — ABNORMAL LOW (ref 12.0–15.0)
MCH: 26 pg (ref 26.0–34.0)
MCH: 26 pg (ref 26.0–34.0)
MCHC: 32.4 g/dL (ref 30.0–36.0)
MCHC: 32.5 g/dL (ref 30.0–36.0)
MCV: 80.3 fL (ref 78.0–100.0)
MCV: 80.2 fL (ref 78.0–100.0)
PLATELETS: 207 10*3/uL (ref 150–400)
Platelets: 189 10*3/uL (ref 150–400)
RBC: 3.81 MIL/uL — ABNORMAL LOW (ref 3.87–5.11)
RBC: 3.84 MIL/uL — ABNORMAL LOW (ref 3.87–5.11)
RDW: 14.6 % (ref 11.5–15.5)
RDW: 14.5 % (ref 11.5–15.5)
WBC: 6.8 10*3/uL (ref 4.0–10.5)
WBC: 6.4 10*3/uL (ref 4.0–10.5)

## 2013-08-30 LAB — TYPE AND SCREEN
ABO/RH(D): B POS
ANTIBODY SCREEN: NEGATIVE

## 2013-08-30 LAB — RPR: RPR Ser Ql: NONREACTIVE

## 2013-08-30 LAB — URINE MICROSCOPIC-ADD ON

## 2013-08-30 LAB — POCT URINALYSIS DIPSTICK
Bilirubin, UA: NEGATIVE
Glucose, UA: NEGATIVE
KETONES UA: NEGATIVE
Leukocytes, UA: NEGATIVE
Nitrite, UA: NEGATIVE
PH UA: 8
Urobilinogen, UA: NEGATIVE

## 2013-08-30 LAB — ABO/RH: ABO/RH(D): B POS

## 2013-08-30 MED ORDER — LIDOCAINE HCL (PF) 1 % IJ SOLN
30.0000 mL | INTRAMUSCULAR | Status: DC | PRN
  Filled 2013-08-30: qty 30

## 2013-08-30 MED ORDER — OXYTOCIN BOLUS FROM INFUSION: 500.0000 mL | INTRAVENOUS | Status: DC

## 2013-08-30 MED ORDER — CITRIC ACID-SODIUM CITRATE 334-500 MG/5ML PO SOLN
30.0000 mL | ORAL | Status: DC | PRN
  Administered 2013-09-01: 30 mL via ORAL
  Filled 2013-08-30: qty 15

## 2013-08-30 MED ORDER — OXYTOCIN 40 UNITS IN LACTATED RINGERS INFUSION - SIMPLE MED
62.5000 mL/h | INTRAVENOUS | Status: DC
  Filled 2013-08-30: qty 1000

## 2013-08-30 MED ORDER — MAGNESIUM SULFATE BOLUS VIA INFUSION
4.0000 g | Freq: Once | INTRAVENOUS | Status: AC
  Administered 2013-08-30: 4 g via INTRAVENOUS
  Filled 2013-08-30: qty 500

## 2013-08-30 MED ORDER — IBUPROFEN 600 MG PO TABS: 600.0000 mg | ORAL_TABLET | Freq: Four times a day (QID) | ORAL | Status: DC | PRN

## 2013-08-30 MED ORDER — LACTATED RINGERS IV SOLN
INTRAVENOUS | Status: DC
  Administered 2013-08-30 – 2013-09-01 (×4): via INTRAVENOUS

## 2013-08-30 MED ORDER — PENICILLIN G POTASSIUM 5000000 UNITS IJ SOLR
2.5000 10*6.[IU] | INTRAVENOUS | Status: DC
  Administered 2013-08-31 – 2013-09-01 (×10): 2.5 10*6.[IU] via INTRAVENOUS
  Filled 2013-08-30 (×18): qty 2.5

## 2013-08-30 MED ORDER — MISOPROSTOL 25 MCG QUARTER TABLET
25.0000 ug | ORAL_TABLET | ORAL | Status: DC
  Administered 2013-08-30 – 2013-09-01 (×7): 25 ug via VAGINAL
  Filled 2013-08-30 (×7): qty 0.25

## 2013-08-30 MED ORDER — ACETAMINOPHEN 325 MG PO TABS: 650.0000 mg | ORAL_TABLET | ORAL | Status: DC | PRN

## 2013-08-30 MED ORDER — OXYCODONE-ACETAMINOPHEN 5-325 MG PO TABS: 1.0000 | ORAL_TABLET | ORAL | Status: DC | PRN

## 2013-08-30 MED ORDER — FLEET ENEMA 7-19 GM/118ML RE ENEM: 1.0000 | ENEMA | RECTAL | Status: DC | PRN

## 2013-08-30 MED ORDER — PENICILLIN G POTASSIUM 5000000 UNITS IJ SOLR
5.0000 10*6.[IU] | Freq: Once | INTRAVENOUS | Status: AC
  Administered 2013-08-30: 5 10*6.[IU] via INTRAVENOUS
  Filled 2013-08-30: qty 5

## 2013-08-30 MED ORDER — MAGNESIUM SULFATE 40 G IN LACTATED RINGERS - SIMPLE
2.0000 g/h | INTRAVENOUS | Status: AC
  Administered 2013-09-01: 2 g/h via INTRAVENOUS
  Filled 2013-08-30 (×3): qty 500

## 2013-08-30 MED ORDER — LACTATED RINGERS IV SOLN: 500.0000 mL | INTRAVENOUS | Status: DC | PRN

## 2013-08-30 MED ORDER — ONDANSETRON HCL 4 MG/2ML IJ SOLN: 4.0000 mg | Freq: Four times a day (QID) | INTRAMUSCULAR | Status: DC | PRN

## 2013-08-30 NOTE — MAU Provider Note (Signed)
History         Chief Complaint  Patient presents with  . Hypertension   HPI This is a 23 y.o. female at [redacted]w[redacted]d who presents for evaluation of elevated BP.  Denies headache or visual changes Does have swelling. Not feeling contractions.   RN Note:  Patient states she was seen in the office today for a regular visit and her blood pressure was elevated. Sent to MAU for further evaluation. Denies bleeding, leaking or contractions and reports good fetal movement.        OB History   Grav Para Term Preterm Abortions TAB SAB Ect Mult Living   2    1     0      Past Medical History  Diagnosis Date  . Asthma     Past Surgical History  Procedure Laterality Date  . No past surgeries      Family History  Problem Relation Age of Onset  . Hypertension Mother   . Diabetes Mother   . Hypertension Father   . Diabetes Father     History  Substance Use Topics  . Smoking status: Former Smoker    Types: Cigars  . Smokeless tobacco: Not on file  . Alcohol Use: No    Allergies: No Known Allergies  Prescriptions prior to admission  Medication Sig Dispense Refill  . folic acid (FOLVITE) 1 MG tablet Take 1 tablet (1 mg total) by mouth daily.  90 tablet  3  . omeprazole (PRILOSEC) 20 MG capsule Take 1 capsule (20 mg total) by mouth 2 (two) times daily before a meal.  60 capsule  5  . ondansetron (ZOFRAN ODT) 8 MG disintegrating tablet Take 1 tablet (8 mg total) by mouth every 8 (eight) hours as needed for nausea or vomiting.  20 tablet  0  . Prenatal Vit-FePoly-FA-DHA (VITAFOL-ONE) 29-1-200 MG CAPS Take 1 capsule by mouth daily before breakfast.  90 capsule  3    Review of Systems  Constitutional: Negative for fever, chills and malaise/fatigue.  Eyes: Negative for blurred vision and double vision.  Cardiovascular: Positive for leg swelling.  Gastrointestinal: Negative for nausea, vomiting and abdominal pain.  Neurological: Negative for dizziness, focal weakness, weakness and  headaches.   Physical Exam   Blood pressure 164/103, pulse 80, temperature 99.5 F (37.5 C), temperature source Oral, resp. rate 16, height 5\' 4"  (1.626 m), weight 115.304 kg (254 lb 3.2 oz), last menstrual period 11/21/2012, SpO2 98.00%.  Filed Vitals:   08/30/13 1559 08/30/13 1614 08/30/13 1629 08/30/13 1633  BP: 153/83 156/41 159/101 162/98  Pulse: 87 88 99 87  Temp:      TempSrc:      Resp:      Height:      Weight:      SpO2:        Physical Exam  Constitutional: She is oriented to person, place, and time. She appears well-developed and well-nourished. No distress.  HENT:  Head: Normocephalic.  Neck: Normal range of motion. Neck supple.  Cardiovascular: Normal rate.   Respiratory: Effort normal.  GI: Soft. She exhibits no distension. There is no tenderness. There is no rebound and no guarding.  Musculoskeletal: Normal range of motion. She exhibits edema (1+).  Neurological: She is alert and oriented to person, place, and time. She has normal reflexes. She displays normal reflexes. She exhibits normal muscle tone (one beat clonus).  Skin: Skin is warm and dry.  Psychiatric: She has a normal mood and affect.  FHR reassuring both twins Uterine irritability noted  MAU Course  Procedures  MDM Results for orders placed during the hospital encounter of 08/30/13 (from the past 24 hour(s))  URINALYSIS, ROUTINE W REFLEX MICROSCOPIC     Status: Abnormal   Collection Time    08/30/13  3:25 PM      Result Value Ref Range   Color, Urine YELLOW  YELLOW   APPearance CLEAR  CLEAR   Specific Gravity, Urine 1.015  1.005 - 1.030   pH 7.5  5.0 - 8.0   Glucose, UA NEGATIVE  NEGATIVE mg/dL   Hgb urine dipstick TRACE (*) NEGATIVE   Bilirubin Urine NEGATIVE  NEGATIVE   Ketones, ur NEGATIVE  NEGATIVE mg/dL   Protein, ur 161100 (*) NEGATIVE mg/dL   Urobilinogen, UA 4.0 (*) 0.0 - 1.0 mg/dL   Nitrite NEGATIVE  NEGATIVE   Leukocytes, UA NEGATIVE  NEGATIVE  URINE MICROSCOPIC-ADD ON      Status: Abnormal   Collection Time    08/30/13  3:25 PM      Result Value Ref Range   Squamous Epithelial / LPF FEW (*) RARE   WBC, UA 3-6  <3 WBC/hpf   RBC / HPF 11-20  <3 RBC/hpf   Bacteria, UA MANY (*) RARE   Urine-Other MUCOUS PRESENT    CBC     Status: Abnormal   Collection Time    08/30/13  4:05 PM      Result Value Ref Range   WBC 6.8  4.0 - 10.5 K/uL   RBC 3.81 (*) 3.87 - 5.11 MIL/uL   Hemoglobin 9.9 (*) 12.0 - 15.0 g/dL   HCT 09.630.6 (*) 04.536.0 - 40.946.0 %   MCV 80.3  78.0 - 100.0 fL   MCH 26.0  26.0 - 34.0 pg   MCHC 32.4  30.0 - 36.0 g/dL   RDW 81.114.6  91.411.5 - 78.215.5 %   Platelets 189  150 - 400 K/uL  COMPREHENSIVE METABOLIC PANEL     Status: Abnormal   Collection Time    08/30/13  4:05 PM      Result Value Ref Range   Sodium 138  137 - 147 mEq/L   Potassium 3.0 (*) 3.7 - 5.3 mEq/L   Chloride 101  96 - 112 mEq/L   CO2 23  19 - 32 mEq/L   Glucose, Bld 93  70 - 99 mg/dL   BUN 3 (*) 6 - 23 mg/dL   Creatinine, Ser 9.560.56  0.50 - 1.10 mg/dL   Calcium 8.3 (*) 8.4 - 10.5 mg/dL   Total Protein 6.5  6.0 - 8.3 g/dL   Albumin 2.3 (*) 3.5 - 5.2 g/dL   AST 62 (*) 0 - 37 U/L   ALT 27  0 - 35 U/L   Alkaline Phosphatase 167 (*) 39 - 117 U/L   Total Bilirubin 0.5  0.3 - 1.2 mg/dL   GFR calc non Af Amer >90  >90 mL/min   GFR calc Af Amer >90  >90 mL/min     Assessment and Plan  A:  Diamniotic, dichorionic twin IUP at 6641w0d--concordant growth      Severe Preeclampsia  GBS ASB  P:  Admit        Induction of labor with cytotec       Magnesium sulfate prophylaxis  PCN GBS prophylaxis  Monique [name],Monique [name] 08/30/2013, 3:31 PM

## 2013-08-30 NOTE — Progress Notes (Signed)
[facility]

## 2013-08-30 NOTE — MAU Note (Signed)
Pt was seen in the MD's office today and was sent over for further evaluation. Pt denies headache, blurred vision,

## 2013-08-30 NOTE — Progress Notes (Signed)
Pulse 90 Pt states that she is having some constipation. Pt has taken OTC colace with some relief.

## 2013-08-30 NOTE — MAU Note (Signed)
Patient states she was seen in the office today for a regular visit and her blood pressure was elevated. Sent to MAU for further evaluation. Denies bleeding, leaking or contractions and reports good fetal movement.

## 2013-08-31 ENCOUNTER — Encounter: Payer: Self-pay | Admitting: Obstetrics

## 2013-08-31 ENCOUNTER — Ambulatory Visit (HOSPITAL_COMMUNITY): Payer: Medicaid Other

## 2013-08-31 DIAGNOSIS — O1410 Severe pre-eclampsia, unspecified trimester: Secondary | ICD-10-CM | POA: Diagnosis present

## 2013-08-31 LAB — COMPREHENSIVE METABOLIC PANEL
ALT: 28 U/L (ref 0–35)
AST: 52 U/L — ABNORMAL HIGH (ref 0–37)
Albumin: 2.3 g/dL — ABNORMAL LOW (ref 3.5–5.2)
Alkaline Phosphatase: 168 U/L — ABNORMAL HIGH (ref 39–117)
BUN: <3 mg/dL — ABNORMAL LOW (ref 6–23)
CHLORIDE: 104 meq/L (ref 96–112)
CO2: 23 meq/L (ref 19–32)
Calcium: 7.5 mg/dL — ABNORMAL LOW (ref 8.4–10.5)
Creatinine, Ser: 0.6 mg/dL (ref 0.50–1.10)
GLUCOSE: 120 mg/dL — AB (ref 70–99)
Potassium: 3.3 mEq/L — ABNORMAL LOW (ref 3.7–5.3)
SODIUM: 142 meq/L (ref 137–147)
Total Bilirubin: 0.4 mg/dL (ref 0.3–1.2)
Total Protein: 6.5 g/dL (ref 6.0–8.3)

## 2013-08-31 MED ORDER — LABETALOL HCL 200 MG PO TABS
200.0000 mg | ORAL_TABLET | Freq: Three times a day (TID) | ORAL | Status: DC
  Administered 2013-08-31 – 2013-09-04 (×12): 200 mg via ORAL
  Filled 2013-08-31 (×16): qty 1

## 2013-08-31 MED ORDER — POTASSIUM CHLORIDE CRYS ER 20 MEQ PO TBCR
20.0000 meq | EXTENDED_RELEASE_TABLET | Freq: Two times a day (BID) | ORAL | Status: DC
  Administered 2013-08-31 – 2013-09-01 (×3): 20 meq via ORAL
  Filled 2013-08-31 (×5): qty 1

## 2013-08-31 MED ORDER — OXYTOCIN 40 UNITS IN LACTATED RINGERS INFUSION - SIMPLE MED
1.0000 m[IU]/min | INTRAVENOUS | Status: DC
  Administered 2013-08-31: 2 m[IU]/min via INTRAVENOUS
  Administered 2013-09-01: 4 m[IU]/min via INTRAVENOUS
  Administered 2013-09-01: 2 m[IU]/min via INTRAVENOUS
  Filled 2013-08-31: qty 1000

## 2013-08-31 MED ORDER — TERBUTALINE SULFATE 1 MG/ML IJ SOLN: 0.2500 mg | Freq: Once | INTRAMUSCULAR | Status: AC | PRN

## 2013-08-31 NOTE — Progress Notes (Signed)
Dr Clearance CootsHarper notified of FHr tracings, UCs, SVE, pat pain level, amt of pit, and patient's expression for frustration of labor process. Reported to physician that patient has been educated on the importance of her staying inpatient, and remaining on the monitor.  Order to D/C pitocin, and continue with cytotec.  After discussion with nurse and MD, pt verbalizes understanding and has no further questions.

## 2013-08-31 NOTE — Progress Notes (Signed)
Discussed plan for delivery with Dr Tamela OddiJackson Moore in OR, informed provider of GBS status provider request continuing PCN. Verified vertex/vertex presentation by US. No additional orders given at this time.

## 2013-08-31 NOTE — H&P (Signed)
Monique [name] is a 23 y.o. female presenting for elevated BP. Maternal Medical History:  Reason for admission: 23 yo  G2 P0.  EDC 09-19-13.  Twin gestation.  Presented to office with elevated BP.  [facility].  Admitted for IOL for preeclampsia.  Fetal activity: Perceived fetal activity is normal.   Last perceived fetal movement was within the past hour.    Prenatal complications: PIH.   Prenatal Complications - Diabetes: none.    OB History   Grav Para Term Preterm Abortions TAB SAB Ect Mult Living   2    1     0     Past Medical History  Diagnosis Date  . Asthma    Past Surgical History  Procedure Laterality Date  . No past surgeries     Family History: family history includes Diabetes in her father and mother; Hypertension in her father and mother. Social History:  reports that she has quit smoking. Her smoking use included Cigars. She does not have any smokeless tobacco history on file. She reports that she does not drink alcohol or use illicit drugs.   Prenatal Transfer Tool  Maternal Diabetes: No Genetic Screening: Normal Maternal Ultrasounds/Referrals: Normal Fetal Ultrasounds or other Referrals:  None Maternal Substance Abuse:  No Significant Maternal Medications:  None Significant Maternal Lab Results:  None Other Comments:  None  Review of Systems  All other systems reviewed and are negative.    Dilation: 2 Effacement (%): 50 Station: -2 Exam by:: L.Stubbs, RN Blood pressure 153/80, pulse 89, temperature 97.3 F (36.3 C), temperature source Oral, resp. rate 18, height 5\' 4"  (1.626 m), weight 254 lb 3.2 oz (115.304 kg), last menstrual period 11/21/2012, SpO2 100.00%. Maternal Exam:  Abdomen: Patient reports no abdominal tenderness. Fetal presentation: vertex  Introitus: Normal vulva. Normal vagina.  Pelvis: adequate for delivery.   Cervix: Cervix evaluated by digital exam.     Physical Exam  Nursing note and vitals  reviewed. Constitutional: She is oriented to person, place, and time. She appears well-developed and well-nourished.  HENT:  Head: Normocephalic and atraumatic.  Eyes: Conjunctivae are normal. Pupils are equal, round, and reactive to light.  Neck: Normal range of motion. Neck supple.  Cardiovascular: Normal rate and regular rhythm.   Respiratory: Effort normal.  GI: Soft.  Genitourinary: Vagina normal and uterus normal.  Musculoskeletal: Normal range of motion.  Neurological: She is alert and oriented to person, place, and time.  Skin: Skin is warm and dry.  Psychiatric: She has a normal mood and affect. Her behavior is normal. Judgment and thought content normal.    Prenatal labs: ABO, Rh: --/--/B POS, B POS (02/25 1734) Antibody: NEG (02/25 1734) Rubella: 4.14 (10/21 1328) RPR: NON REACTIVE (02/25 1734)  HBsAg: NEGATIVE (10/21 1328)  HIV: NON REACTIVE (12/15 1336)  GBS: NEGATIVE (02/10 1511)   Assessment/Plan: 37 weeks.  Twins.  Preeclampsia.  2 stage IOL.  Magnesium sulfate seizure prophylaxis.   HARPER,CHARLES A 08/31/2013, 7:39 AM

## 2013-08-31 NOTE — Progress Notes (Signed)
Monique [name] is a 23 y.o. G2P0010 at 144w1d by LMP admitted for induction of labor due to Pre-eclamptic toxemia of pregnancy..  Subjective: Comfortable  Objective: BP 163/103  Pulse 101  Temp(Src) 98.2 F (36.8 C) (Oral)  Resp 16  Ht 5\' 4"  (1.626 m)  Wt 115.304 kg (254 lb 3.2 oz)  BMI 43.61 kg/m2  SpO2 100%  LMP 11/21/2012 I/O last 3 completed shifts: In: 1919.2 [I.V.:1719.2; IV Piggyback:200] Out: 4300 [Urine:4300] Total I/O In: 715 [P.O.:240; I.V.:375; IV Piggyback:100] Out: 950 [Urine:950]  FHT:  140s x 2 w/accelerations, no decelerations UC:   irregular SVE:   Dilation: 2 Effacement (%): 50 Station: -2 Exam by:: Tressia DanasL. McDaniel RN  Labs: Lab Results  Component Value Date   WBC 6.4 08/30/2013   HGB 10.0* 08/30/2013   HCT 30.8* 08/30/2013   MCV 80.2 08/30/2013   PLT 207 08/30/2013    Assessment / Plan: Present on Admission:  . Indication for care in labor and delivery, antepartum . Dichorionic diamniotic twin gestation--Cephalic x 2 . Severe preeclampsia   Labor: Early labor; start Pitocin low dose per protocol Preeclampsia:  on magnesium sulfate, no signs or symptoms of toxicity and intake and ouput balanced Fetal Wellbeing:  Category I x 2 Pain Control:  Labor support without medications I/D:  n/a Anticipated MOD:  NSVD  JACKSON-MOORE,Koren Plyler A 08/31/2013, 11:03 AM

## 2013-09-01 ENCOUNTER — Encounter (HOSPITAL_COMMUNITY): Payer: Medicaid Other | Admitting: Anesthesiology

## 2013-09-01 ENCOUNTER — Inpatient Hospital Stay (HOSPITAL_COMMUNITY): Payer: Medicaid Other | Admitting: Anesthesiology

## 2013-09-01 ENCOUNTER — Encounter (HOSPITAL_COMMUNITY)
Admission: AD | Disposition: A | Discharge status: Home / Self Care | Payer: Self-pay | Source: Ambulatory Visit | Attending: Obstetrics & Gynecology

## 2013-09-01 ENCOUNTER — Encounter (HOSPITAL_COMMUNITY): Payer: Self-pay | Admitting: *Deleted

## 2013-09-01 DIAGNOSIS — O30009 Twin pregnancy, unspecified number of placenta and unspecified number of amniotic sacs, unspecified trimester: Secondary | ICD-10-CM

## 2013-09-01 LAB — CBC
HCT: 30.8 % — ABNORMAL LOW (ref 36.0–46.0)
Hemoglobin: 10 g/dL — ABNORMAL LOW (ref 12.0–15.0)
MCH: 26.5 pg (ref 26.0–34.0)
MCHC: 32.5 g/dL (ref 30.0–36.0)
MCV: 81.7 fL (ref 78.0–100.0)
Platelets: 219 10*3/uL (ref 150–400)
RBC: 3.77 MIL/uL — ABNORMAL LOW (ref 3.87–5.11)
RDW: 15 % (ref 11.5–15.5)
WBC: 7 10*3/uL (ref 4.0–10.5)

## 2013-09-01 SURGERY — Surgical Case
Anesthesia: Spinal | Site: Abdomen

## 2013-09-01 MED ORDER — DEXTROSE 5 % IV SOLN
500.0000 mg | Freq: Once | INTRAVENOUS | Status: AC
  Administered 2013-09-01: 500 mg via INTRAVENOUS
  Filled 2013-09-01: qty 500

## 2013-09-01 MED ORDER — MORPHINE SULFATE 0.5 MG/ML IJ SOLN
INTRAMUSCULAR | Status: AC
  Filled 2013-09-01: qty 10

## 2013-09-01 MED ORDER — SIMETHICONE 80 MG PO CHEW
80.0000 mg | CHEWABLE_TABLET | ORAL | Status: DC
  Administered 2013-09-01 – 2013-09-03 (×3): 80 mg via ORAL
  Filled 2013-09-01 (×3): qty 1

## 2013-09-01 MED ORDER — OXYTOCIN 40 UNITS IN LACTATED RINGERS INFUSION - SIMPLE MED: 62.5000 mL/h | INTRAVENOUS | Status: AC

## 2013-09-01 MED ORDER — LACTATED RINGERS IV SOLN
INTRAVENOUS | Status: DC | PRN
  Administered 2013-09-01: 14:00:00 via INTRAVENOUS

## 2013-09-01 MED ORDER — PHENYLEPHRINE HCL 10 MG/ML IJ SOLN
INTRAMUSCULAR | Status: DC | PRN
  Administered 2013-09-01: 200 ug via INTRAVENOUS
  Administered 2013-09-01: 40 ug via INTRAVENOUS
  Administered 2013-09-01: 50 ug via INTRAVENOUS
  Administered 2013-09-01: 100 ug via INTRAVENOUS
  Administered 2013-09-01: 50 ug via INTRAVENOUS
  Administered 2013-09-01: 100 ug via INTRAVENOUS

## 2013-09-01 MED ORDER — ONDANSETRON HCL 4 MG/2ML IJ SOLN
INTRAMUSCULAR | Status: DC | PRN
  Administered 2013-09-01: 4 mg via INTRAVENOUS

## 2013-09-01 MED ORDER — MAGNESIUM HYDROXIDE 400 MG/5ML PO SUSP
30.0000 mL | ORAL | Status: DC | PRN
  Filled 2013-09-01: qty 30

## 2013-09-01 MED ORDER — KETOROLAC TROMETHAMINE 30 MG/ML IJ SOLN
INTRAMUSCULAR | Status: AC
  Administered 2013-09-01: 30 mg via INTRAMUSCULAR
  Filled 2013-09-01: qty 1

## 2013-09-01 MED ORDER — PHENYLEPHRINE 8 MG IN D5W 100 ML (0.08MG/ML) PREMIX OPTIME
INJECTION | INTRAVENOUS | Status: AC
  Filled 2013-09-01: qty 100

## 2013-09-01 MED ORDER — SIMETHICONE 80 MG PO CHEW
80.0000 mg | CHEWABLE_TABLET | ORAL | Status: DC | PRN
  Administered 2013-09-03: 80 mg via ORAL
  Filled 2013-09-01: qty 1

## 2013-09-01 MED ORDER — DIBUCAINE 1 % RE OINT: 1.0000 "application " | TOPICAL_OINTMENT | RECTAL | Status: DC | PRN

## 2013-09-01 MED ORDER — LANOLIN HYDROUS EX OINT: 1.0000 "application " | TOPICAL_OINTMENT | CUTANEOUS | Status: DC | PRN

## 2013-09-01 MED ORDER — DEXTROSE 5 % IV SOLN
INTRAVENOUS | Status: DC | PRN
  Administered 2013-09-01: 14:00:00 via INTRAVENOUS

## 2013-09-01 MED ORDER — ONDANSETRON HCL 4 MG/2ML IJ SOLN
INTRAMUSCULAR | Status: AC
  Filled 2013-09-01: qty 2

## 2013-09-01 MED ORDER — DEXTROSE 5 % IV SOLN
3.0000 g | INTRAVENOUS | Status: DC | PRN
  Administered 2013-09-01: 3 g via INTRAVENOUS

## 2013-09-01 MED ORDER — ONDANSETRON HCL 4 MG/2ML IJ SOLN: 4.0000 mg | INTRAMUSCULAR | Status: DC | PRN

## 2013-09-01 MED ORDER — FENTANYL CITRATE 0.05 MG/ML IJ SOLN: 25.0000 ug | INTRAMUSCULAR | Status: DC | PRN

## 2013-09-01 MED ORDER — MORPHINE SULFATE (PF) 0.5 MG/ML IJ SOLN
INTRAMUSCULAR | Status: DC | PRN
  Administered 2013-09-01: .2 mg via EPIDURAL

## 2013-09-01 MED ORDER — MEASLES, MUMPS & RUBELLA VAC ~~LOC~~ INJ: 0.5000 mL | INJECTION | Freq: Once | SUBCUTANEOUS | Status: DC

## 2013-09-01 MED ORDER — SCOPOLAMINE 1 MG/3DAYS TD PT72
1.0000 | MEDICATED_PATCH | Freq: Once | TRANSDERMAL | Status: DC
  Administered 2013-09-01: 1.5 mg via TRANSDERMAL

## 2013-09-01 MED ORDER — KETOROLAC TROMETHAMINE 30 MG/ML IJ SOLN: 30.0000 mg | Freq: Four times a day (QID) | INTRAMUSCULAR | Status: DC | PRN

## 2013-09-01 MED ORDER — MEPERIDINE HCL 25 MG/ML IJ SOLN: 6.2500 mg | INTRAMUSCULAR | Status: DC | PRN

## 2013-09-01 MED ORDER — BUPIVACAINE IN DEXTROSE 0.75-8.25 % IT SOLN
INTRATHECAL | Status: DC | PRN
  Administered 2013-09-01: 1.8 mL via INTRATHECAL

## 2013-09-01 MED ORDER — PRENATAL MULTIVITAMIN CH
1.0000 | ORAL_TABLET | Freq: Every day | ORAL | Status: DC
  Administered 2013-09-02 – 2013-09-03 (×2): 1 via ORAL
  Filled 2013-09-01 (×2): qty 1

## 2013-09-01 MED ORDER — MAGNESIUM SULFATE 40 G IN LACTATED RINGERS - SIMPLE
INTRAVENOUS | Status: DC | PRN
  Administered 2013-09-01: 25 mL/h via INTRAVENOUS

## 2013-09-01 MED ORDER — WITCH HAZEL-GLYCERIN EX PADS: 1.0000 "application " | MEDICATED_PAD | CUTANEOUS | Status: DC | PRN

## 2013-09-01 MED ORDER — MEPERIDINE HCL 25 MG/ML IJ SOLN
INTRAMUSCULAR | Status: AC
  Filled 2013-09-01: qty 1

## 2013-09-01 MED ORDER — PHENYLEPHRINE HCL 10 MG/ML IJ SOLN
20.0000 mg | INTRAVENOUS | Status: DC | PRN
  Administered 2013-09-01: 40 ug/min via INTRAVENOUS

## 2013-09-01 MED ORDER — SCOPOLAMINE 1 MG/3DAYS TD PT72
MEDICATED_PATCH | TRANSDERMAL | Status: AC
  Administered 2013-09-01: 1.5 mg via TRANSDERMAL
  Filled 2013-09-01: qty 1

## 2013-09-01 MED ORDER — IBUPROFEN 600 MG PO TABS
600.0000 mg | ORAL_TABLET | Freq: Four times a day (QID) | ORAL | Status: DC
  Administered 2013-09-01 – 2013-09-04 (×11): 600 mg via ORAL
  Filled 2013-09-01 (×11): qty 1

## 2013-09-01 MED ORDER — ZOLPIDEM TARTRATE 5 MG PO TABS: 5.0000 mg | ORAL_TABLET | Freq: Every evening | ORAL | Status: DC | PRN

## 2013-09-01 MED ORDER — FERROUS SULFATE 325 (65 FE) MG PO TABS
325.0000 mg | ORAL_TABLET | Freq: Two times a day (BID) | ORAL | Status: DC
  Administered 2013-09-02 – 2013-09-04 (×5): 325 mg via ORAL
  Filled 2013-09-01 (×5): qty 1

## 2013-09-01 MED ORDER — OXYTOCIN 10 UNIT/ML IJ SOLN
INTRAMUSCULAR | Status: AC
  Filled 2013-09-01: qty 4

## 2013-09-01 MED ORDER — 0.9 % SODIUM CHLORIDE (POUR BTL) OPTIME
TOPICAL | Status: DC | PRN
  Administered 2013-09-01: 1000 mL

## 2013-09-01 MED ORDER — OXYTOCIN 10 UNIT/ML IJ SOLN
40.0000 [IU] | INTRAMUSCULAR | Status: DC | PRN
  Administered 2013-09-01: 40 [IU] via INTRAVENOUS

## 2013-09-01 MED ORDER — METOCLOPRAMIDE HCL 5 MG/ML IJ SOLN: 10.0000 mg | Freq: Three times a day (TID) | INTRAMUSCULAR | Status: DC | PRN

## 2013-09-01 MED ORDER — LACTATED RINGERS IV SOLN
INTRAVENOUS | Status: DC
  Administered 2013-09-02: 03:00:00 via INTRAVENOUS

## 2013-09-01 MED ORDER — PHENYLEPHRINE 40 MCG/ML (10ML) SYRINGE FOR IV PUSH (FOR BLOOD PRESSURE SUPPORT)
PREFILLED_SYRINGE | INTRAVENOUS | Status: AC
  Filled 2013-09-01: qty 15

## 2013-09-01 MED ORDER — ONDANSETRON HCL 4 MG PO TABS: 4.0000 mg | ORAL_TABLET | ORAL | Status: DC | PRN

## 2013-09-01 MED ORDER — SENNOSIDES-DOCUSATE SODIUM 8.6-50 MG PO TABS
2.0000 | ORAL_TABLET | ORAL | Status: DC
  Administered 2013-09-01 – 2013-09-03 (×3): 2 via ORAL
  Filled 2013-09-01 (×3): qty 2

## 2013-09-01 MED ORDER — OXYCODONE-ACETAMINOPHEN 5-325 MG PO TABS
1.0000 | ORAL_TABLET | ORAL | Status: DC | PRN
  Administered 2013-09-02: 1 via ORAL
  Administered 2013-09-02: 2 via ORAL
  Administered 2013-09-03 – 2013-09-04 (×4): 1 via ORAL
  Filled 2013-09-01: qty 2
  Filled 2013-09-01 (×5): qty 1

## 2013-09-01 MED ORDER — DEXTROSE 5 % IV SOLN
INTRAVENOUS | Status: AC
  Filled 2013-09-01: qty 3000

## 2013-09-01 MED ORDER — DIPHENHYDRAMINE HCL 25 MG PO CAPS: 25.0000 mg | ORAL_CAPSULE | Freq: Four times a day (QID) | ORAL | Status: DC | PRN

## 2013-09-01 MED ORDER — PHENYLEPHRINE 40 MCG/ML (10ML) SYRINGE FOR IV PUSH (FOR BLOOD PRESSURE SUPPORT)
PREFILLED_SYRINGE | INTRAVENOUS | Status: AC
  Filled 2013-09-01: qty 10

## 2013-09-01 MED ORDER — TETANUS-DIPHTH-ACELL PERTUSSIS 5-2.5-18.5 LF-MCG/0.5 IM SUSP
0.5000 mL | Freq: Once | INTRAMUSCULAR | Status: AC
  Administered 2013-09-02: 0.5 mL via INTRAMUSCULAR
  Filled 2013-09-01 (×2): qty 0.5

## 2013-09-01 MED ORDER — KETOROLAC TROMETHAMINE 30 MG/ML IJ SOLN
30.0000 mg | Freq: Four times a day (QID) | INTRAMUSCULAR | Status: DC | PRN
  Administered 2013-09-01: 30 mg via INTRAMUSCULAR

## 2013-09-01 SURGICAL SUPPLY — 49 items
BENZOIN TINCTURE PRP APPL 2/3 (GAUZE/BANDAGES/DRESSINGS) ×3 IMPLANT
CANISTER WOUND CARE 500ML ATS (WOUND CARE) IMPLANT
CLAMP CORD UMBIL (MISCELLANEOUS) ×6 IMPLANT
CLOSURE WOUND 1/2 X4 (GAUZE/BANDAGES/DRESSINGS) ×1
CLOTH BEACON ORANGE TIMEOUT ST (SAFETY) ×3 IMPLANT
CONTAINER PREFILL 10% NBF 15ML (MISCELLANEOUS) IMPLANT
DRAPE LG THREE QUARTER DISP (DRAPES) IMPLANT
DRSG OPSITE POSTOP 4X10 (GAUZE/BANDAGES/DRESSINGS) ×3 IMPLANT
DRSG VAC ATS LRG SENSATRAC (GAUZE/BANDAGES/DRESSINGS) IMPLANT
DRSG VAC ATS MED SENSATRAC (GAUZE/BANDAGES/DRESSINGS) IMPLANT
DRSG VAC ATS SM SENSATRAC (GAUZE/BANDAGES/DRESSINGS) IMPLANT
DURAPREP 26ML APPLICATOR (WOUND CARE) ×3 IMPLANT
ELECT REM PT RETURN 9FT ADLT (ELECTROSURGICAL) ×3
ELECTRODE REM PT RTRN 9FT ADLT (ELECTROSURGICAL) ×1 IMPLANT
EXTRACTOR VACUUM M CUP 4 TUBE (SUCTIONS) IMPLANT
EXTRACTOR VACUUM M CUP 4' TUBE (SUCTIONS)
GLOVE BIO SURGEON STRL SZ 6.5 (GLOVE) ×2 IMPLANT
GLOVE BIO SURGEONS STRL SZ 6.5 (GLOVE) ×1
GOWN STRL REUS W/TWL LRG LVL3 (GOWN DISPOSABLE) ×6 IMPLANT
KIT ABG SYR 3ML LUER SLIP (SYRINGE) IMPLANT
NDL SAFETY ECLIPSE 18X1.5 (NEEDLE) ×1 IMPLANT
NEEDLE HYPO 18GX1.5 SHARP (NEEDLE) ×2
NEEDLE HYPO 25X5/8 SAFETYGLIDE (NEEDLE) ×3 IMPLANT
NS IRRIG 1000ML POUR BTL (IV SOLUTION) ×3 IMPLANT
PACK C SECTION WH (CUSTOM PROCEDURE TRAY) ×3 IMPLANT
PAD ABD 7.5X8 STRL (GAUZE/BANDAGES/DRESSINGS) ×3 IMPLANT
PAD OB MATERNITY 4.3X12.25 (PERSONAL CARE ITEMS) ×3 IMPLANT
RTRCTR C-SECT PINK 25CM LRG (MISCELLANEOUS) ×3 IMPLANT
SCRUB PCMX 4 OZ (MISCELLANEOUS) ×3 IMPLANT
SPONGE GAUZE 4X4 12PLY STER LF (GAUZE/BANDAGES/DRESSINGS) ×3 IMPLANT
STAPLER VISISTAT 35W (STAPLE) IMPLANT
STRIP CLOSURE SKIN 1/2X4 (GAUZE/BANDAGES/DRESSINGS) ×2 IMPLANT
SUT MNCRL 0 VIOLET CTX 36 (SUTURE) ×2 IMPLANT
SUT MNCRL AB 3-0 PS2 27 (SUTURE) ×3 IMPLANT
SUT MONOCRYL 0 CTX 36 (SUTURE) ×4
SUT PDS AB 0 CTX 36 PDP370T (SUTURE) ×3 IMPLANT
SUT PLAIN 0 NONE (SUTURE) IMPLANT
SUT VIC AB 0 CTXB 36 (SUTURE) ×3 IMPLANT
SUT VIC AB 2-0 CT1 (SUTURE) ×3 IMPLANT
SUT VIC AB 2-0 CT1 27 (SUTURE) ×2
SUT VIC AB 2-0 CT1 TAPERPNT 27 (SUTURE) ×1 IMPLANT
SUT VIC AB 2-0 SH 27 (SUTURE)
SUT VIC AB 2-0 SH 27XBRD (SUTURE) IMPLANT
SYR BULB 3OZ (MISCELLANEOUS) ×3 IMPLANT
SYRINGE 10CC LL (SYRINGE) ×3 IMPLANT
TAPE CLOTH SURG 4X10 WHT LF (GAUZE/BANDAGES/DRESSINGS) ×3 IMPLANT
TOWEL OR 17X24 6PK STRL BLUE (TOWEL DISPOSABLE) ×3 IMPLANT
TRAY FOLEY CATH 14FR (SET/KITS/TRAYS/PACK) ×3 IMPLANT
WATER STERILE IRR 1000ML POUR (IV SOLUTION) IMPLANT

## 2013-09-01 NOTE — Anesthesia Procedure Notes (Signed)
Spinal  Patient location during procedure: OB Start time: 09/01/2013 1:40 PM End time: 09/01/2013 1:46 PM Staffing Anesthesiologist: Dawnette Mione, CHRIS Preanesthetic Checklist Completed: patient identified, surgical consent, pre-op evaluation, timeout performed, IV checked, risks and benefits discussed and monitors and equipment checked Spinal Block Patient position: sitting Prep: site prepped and draped and DuraPrep Patient monitoring: heart rate, cardiac monitor, continuous pulse ox and blood pressure Approach: midline Location: L3-4 Injection technique: single-shot Needle Needle type: Pencan  Needle gauge: 24 G Needle length: 10 cm Assessment Sensory level: T4

## 2013-09-01 NOTE — Anesthesia Postprocedure Evaluation (Signed)
  Anesthesia Post-op Note  Patient: Monique [name]  Procedure(s) Performed: Procedure(s): CESAREAN SECTION (N/A)  Patient Location: PACU  Anesthesia Type:Spinal  Level of Consciousness: awake, alert  and oriented  Airway and Oxygen Therapy: Patient Spontanous Breathing  Post-op Pain: mild  Post-op Assessment: Post-op Vital signs reviewed, Patient's Cardiovascular Status Stable, Respiratory Function Stable, Patent Airway, No signs of Nausea or vomiting and Pain level controlled  Post-op Vital Signs: Reviewed and stable  Complications: No apparent anesthesia complications

## 2013-09-01 NOTE — Progress Notes (Signed)
Patient ID: Monique [name], female   DOB: 03/21/1991, 23 y.o.   MRN: [id number] Monique [name] is a [age] y.o. G2P0010 at 7258w2d by LMP admitted for induction of labor due to severe preeclampsia.  Subjective: Comfortable  Objective: BP 152/100  Pulse 97  Temp(Src) 98.4 F (36.9 C) (Oral)  Resp 18  Ht 5\' 4"  (1.626 m)  Wt 115.304 kg (254 lb 3.2 oz)  BMI 43.61 kg/m2  SpO2 100%  LMP 11/21/2012 I/O last 3 completed shifts: In: 7071.9 [P.O.:2012; I.V.:4259.9; IV Piggyback:800] Out: 9926 [Urine:9925; Stool:1] Total I/O In: 2148.9 [P.O.:1208; I.V.:740.9; IV Piggyback:200] Out: 2650 [Urine:2650]  FHT:  FHR: 140 bpm, variability: moderate,  accelerations:  Present,  decelerations:  Absent UC:   Not monitored presently. SVE:   Dilation: 2 Effacement (%): 70 Station: -2 Exam by:: Jackson-Moore,   Labs: Lab Results  Component Value Date   WBC 7.0 09/01/2013   HGB 10.0* 09/01/2013   HCT 30.8* 09/01/2013   MCV 81.7 09/01/2013   PLT 219 09/01/2013    Assessment / Plan: Present on Admission:  . Indication for care in labor and delivery, antepartum . Dichorionic diamniotic twin gestation . Severe preeclampsia Failed IOL  Labor: Early Preeclampsia:  on magnesium sulfate Fetal Wellbeing:  Category I x 2 Pain Control:  None I/D:  n/a Anticipated MOD:  C/D--risks of the procedure reviewed  JACKSON-MOORE,Tao Satz A 09/01/2013, 1:36 PM

## 2013-09-01 NOTE — Progress Notes (Signed)
Patient given patient care guide and advance directives booklet and reviewed them as well as patient rights with patient.

## 2013-09-01 NOTE — Anesthesia Preprocedure Evaluation (Signed)
Anesthesia Evaluation    History of Anesthesia Complications Negative for: history of anesthetic complications  Airway Mallampati: III TM Distance: >3 FB Neck ROM: Full    Dental  (+) Teeth Intact   Pulmonary former smoker,  Childhood asthma resolved, no inhalers.    Pulmonary exam normal       Cardiovascular hypertension, Pt. on medications Rhythm:Regular Rate:Normal  Pre eclampsia   Neuro/Psych negative neurological ROS  negative psych ROS   GI/Hepatic Neg liver ROS, GERD-  Medicated and Controlled,  Endo/Other  negative endocrine ROS  Renal/GU negative Renal ROS     Musculoskeletal negative musculoskeletal ROS (+)   Abdominal   Peds  Hematology  (+) anemia ,   Anesthesia Other Findings   Reproductive/Obstetrics (+) Pregnancy                           Anesthesia Physical Anesthesia Plan  ASA: III  Anesthesia Plan: Spinal   Post-op Pain Management:    Induction:   Airway Management Planned: Natural Airway  Additional Equipment: None  Intra-op Plan:   Post-operative Plan:   Informed Consent: I have reviewed the patients History and Physical, chart, labs and discussed the procedure including the risks, benefits and alternatives for the proposed anesthesia with the patient or authorized representative who has indicated his/her understanding and acceptance.   Dental advisory given  Plan Discussed with: CRNA and Surgeon  Anesthesia Plan Comments:         Anesthesia Quick Evaluation

## 2013-09-01 NOTE — Progress Notes (Signed)
Monique [name] is [name] 23 y.o. G2P0010 at 5028w2d by LMP admitted for induction of labor due to Hypertension.  Subjective:   Objective: BP 141/83  Pulse 97  Temp(Src) 98.4 F (36.9 C) (Oral)  Resp 16  Ht 5\' 4"  (1.626 m)  Wt 254 lb 3.2 oz (115.304 kg)  BMI 43.61 kg/m2  SpO2 100%  LMP 11/21/2012 I/O last 3 completed shifts: In: 4754 [P.O.:1040; I.V.:3214; IV Piggyback:500] Out: 6475 [Urine:6475] Total I/O In: 2307 [P.O.:972; I.V.:1035; IV Piggyback:300] Out: 2651 [Urine:2650; Stool:1]  FHT:  FHR: 140 bpm, variability: moderate,  accelerations:  Present,  decelerations:  Absent UC:   Not monitored presently. SVE:   Dilation: 3 Effacement (%): 80 Station: -3 Exam by:: Monique [name]   Labs: Lab Results  Component Value Date   WBC 6.4 08/30/2013   HGB 10.0* 08/30/2013   HCT 30.8* 08/30/2013   MCV 80.2 08/30/2013   PLT 207 08/30/2013    Assessment / Plan: 37 weeks.  Gestational Hypertension.  2 stage IOL.  Labor: Early Preeclampsia:  on magnesium sulfate Fetal Wellbeing:  Category I Pain Control:  None I/D:  n/[name] Anticipated MOD:  NSVD  Monique [name] 09/01/2013, 5:09 AM

## 2013-09-01 NOTE — Op Note (Signed)
Cesarean Section Procedure Note   Nadiya Pledger-Bond   08/30/2013 - 09/01/2013  Indications: Diamniotic-dichorionic twin gestation @ 7450w2d, severe preeclampsia, failed induction of labor  Pre-operative Diagnosis: Diamniotic-dichorionic twin gestation @ 3550w2d, severe preeclampsia, failed induction of labor Post-operative Diagnosis: Same   Surgeon: Roseanna RainbowJACKSON-MOORE,Abelina Ketron A  Assistants: Francoise CeoMARSHALL, BERNARD Anesthesia: spinal  Procedure Details:  The patient was seen in the Holding Room. The risks, benefits, complications, treatment options, and expected outcomes were discussed with the patient. The patient concurred with the proposed plan, giving informed consent. The patient was identified as Denmarkonniqua Pledger-Bond and the procedure verified as C-Section Delivery. A Time Out was held and the above information confirmed.  After induction of anesthesia, the patient was draped and prepped in the usual sterile manner. A transverse incision was made and carried down through the subcutaneous tissue to the fascia. The fascial incision was made and extended transversely. The fascia was separated from the underlying rectus tissue superiorly. The peritoneum was identified and entered. The peritoneal incision was extended longitudinally. An Alexis retractor was placed into the incision.  The utero-vesical peritoneal reflection was incised.  A low transverse uterine incision was made. Delivered from cephalic presentation were living newborn female infants. APGAR (1 MIN):    Pledger-Bond, Boy Kriston [[id number]]  8   Pledger-Bond, BoyB Londyn [[id number]]  8   APGAR (5 MINS):    Pledger-Bond, Boy Sonal [[id number]]  9   Pledger-Bond, BoyB Petina [[id number]]  9   APGAR (10 MINS):    Pledger-Bond, Boy Cassity [[id number]]    Kristen Cardinalledger-Bond, BoyB [place] [[id number]][[id number]]     A cord ph was not sent. The umbilical cords were clamped and cut. A sample was obtained for evaluation. The placenta was removed  Intact and appeared normal.  The uterine incision was closed with running locked sutures of 1-0 Monocryl. A second imbricating layer of the same suture was placed.  Hemostasis was observed. The parieto peritoneum was closed in a running fashion with 2-0 Vicryl.  The fascia was then reapproximated with running sutures of 0 Vicryl.  A running suture of 2-0 Vicryl was placed in the subcutaneous layer.  The skin was closed with suture.  Instrument, sponge, and needle counts were correct prior the abdominal closure and were correct at the conclusion of the case.    Findings:  See above   Estimated Blood Loss: 600 ml  Total IV Fluids: per Anesthesiology  Urine Output: per Anesthesiology  Specimens: Placenta to Pathology  Complications: no complications  Disposition: PACU - hemodynamically stable.  Maternal Condition: stable   Baby condition / location:  Couplet care / Skin to Skin    Signed: Surgeon(s): Antionette CharLisa Jackson-Moore, MD

## 2013-09-01 NOTE — Transfer of Care (Signed)
Immediate Anesthesia Transfer of Care Note  Patient: Monique [name]  Procedure(s) Performed: Procedure(s): CESAREAN SECTION (N/A)  Patient Location: PACU  Anesthesia Type:Spinal  Level of Consciousness: awake, alert  and oriented  Airway & Oxygen Therapy: Patient Spontanous Breathing  Post-op Assessment: Report given to PACU RN  Post vital signs: Reviewed and stable  Complications: No apparent anesthesia complications

## 2013-09-02 LAB — COMPREHENSIVE METABOLIC PANEL
ALBUMIN: 2.2 g/dL — AB (ref 3.5–5.2)
ALT: 21 U/L (ref 0–35)
AST: 41 U/L — ABNORMAL HIGH (ref 0–37)
Alkaline Phosphatase: 162 U/L — ABNORMAL HIGH (ref 39–117)
BILIRUBIN TOTAL: 0.3 mg/dL (ref 0.3–1.2)
CO2: 22 mEq/L (ref 19–32)
CREATININE: 0.76 mg/dL (ref 0.50–1.10)
Calcium: 7.7 mg/dL — ABNORMAL LOW (ref 8.4–10.5)
Chloride: 102 mEq/L (ref 96–112)
GFR calc Af Amer: >90 mL/min (ref >90)
GFR calc non Af Amer: >90 mL/min (ref >90)
Glucose, Bld: 101 mg/dL — ABNORMAL HIGH (ref 70–99)
POTASSIUM: 3.8 meq/L (ref 3.7–5.3)
Sodium: 142 mEq/L (ref 137–147)
Total Protein: 6.3 g/dL (ref 6.0–8.3)

## 2013-09-02 LAB — CBC
HCT: 28.3 % — ABNORMAL LOW (ref 36.0–46.0)
Hemoglobin: 8.9 g/dL — ABNORMAL LOW (ref 12.0–15.0)
MCH: 25.8 pg — ABNORMAL LOW (ref 26.0–34.0)
MCHC: 31.4 g/dL (ref 30.0–36.0)
MCV: 82 fL (ref 78.0–100.0)
Platelets: 205 10*3/uL (ref 150–400)
RBC: 3.45 MIL/uL — ABNORMAL LOW (ref 3.87–5.11)
RDW: 15.2 % (ref 11.5–15.5)
WBC: 9.6 10*3/uL (ref 4.0–10.5)

## 2013-09-02 NOTE — Lactation Note (Signed)
This note was copied from the chart of Monique Chaise [name]. Lactation Consultation Note  Patient Name: Monique [name] Today's Date: 09/02/2013 Reason for consult: Initial assessment;Infant < 6lbs;Multiple gestation  Visited with Mom, babies are 25 hrs old.  Both babies have been nursing well per Mom.  Last feeding 4 hrs prior, babies dressed and showing cues.  Encouraged Mom to feed babies at least every 3 hrs.  Explained that sometimes babies born 37 weeks will tire after 24 hrs and become more difficult to feed at the breast.  Encouraged more skin to skin to stimulate babies to be more alert when at the breast. Explained that she may need to begin pumping with a DEBP, and supplementing with EBM+/formula if babies won't latch and breastfeed every 3 hrs. Assisted with positioning and latching baby A in football hold.  Manual breast expression demonstrated.  Mom needing guidance with latching baby A deeply onto breast.  Baby became rhythmic with swallows heard.  Lots of basic teaching done.  Will assist with baby B after A done.  Offered to help her latch both babies in football hold at same time, but Mom declined.   Brochure left in room.  Informed her of IP and OP lactation services available.  To follow up prn and daily as needed.  Maternal Data Formula Feeding for Exclusion: Yes Reason for exclusion: Admission to Intensive Care Unit (ICU) post-partum Infant to breast within first hour of birth: No Breastfeeding delayed due to:: Infant status Has patient been taught Hand Expression?: Yes Does the patient have breastfeeding experience prior to this delivery?: No  Feeding Feeding Type: Breast Fed  LATCH Score/Interventions Latch: Grasps breast easily, tongue down, lips flanged, rhythmical sucking.  Audible Swallowing: Spontaneous and intermittent Intervention(s): Hand expression;Skin to skin Intervention(s): Skin to skin;Hand expression;Alternate breast massage  Type of  Nipple: Everted at rest and after stimulation  Comfort (Breast/Nipple): Soft / non-tender     Hold (Positioning): Assistance needed to correctly position infant at breast and maintain latch. Intervention(s): Breastfeeding basics reviewed;Support Pillows;Position options;Skin to skin  LATCH Score: 9  Lactation Tools Discussed/Used     Consult Status Consult Status: Follow-up Date: 09/03/13    Mc Hollen E 09/02/2013, 3:41 PM    

## 2013-09-02 NOTE — Addendum Note (Signed)
Addendum created 09/02/13 0902 by Algis GreenhouseLinda A Robertine Kipper, CRNA   Modules edited: Notes Section   Notes Section:  File: [id number]

## 2013-09-02 NOTE — Anesthesia Postprocedure Evaluation (Signed)
Anesthesia Post Note  Patient: Monique [name]  Procedure(s) Performed: Procedure(s) (LRB): CESAREAN SECTION (N/A)  Anesthesia type: Spinal  Patient location: Mother/Baby  Post pain: Pain level controlled  Post assessment: Post-op Vital signs reviewed  Last Vitals:  Filed Vitals:   09/02/13 0821  BP: 157/94  Pulse: 98  Temp: 36.8 C  Resp: 18    Post vital signs: Reviewed  Level of consciousness: awake  Complications: No apparent anesthesia complications

## 2013-09-02 NOTE — Progress Notes (Signed)
Patient ID: Monique [name], female   DOB: 07/26/1990, 23 y.o.   MRN: [id number] Postop day 1 Vital signs normal Discontinue magnesium sulfate today and continue labetalol and observe and

## 2013-09-02 NOTE — Progress Notes (Signed)
Pt breastfeeding

## 2013-09-03 NOTE — Lactation Note (Signed)
This note was copied from the chart of Monique Melita [name]. Lactation Consultation Note Mom states she has decided to only formula feed.  Offered assist or setting up a DEBP if she desires.  She will let us know if she desires either.  Patient Name: Monique [name] Today's Date: 09/03/2013     Maternal Data    Feeding    LATCH Score/Interventions                      Lactation Tools Discussed/Used     Consult Status      Powell, Raeanne Deschler Ann 09/03/2013, 1:50 PM    

## 2013-09-03 NOTE — Progress Notes (Signed)
Patipostop day 2 Vital signs normal Doing well no complaintsent ID: Annais Pledger-Bond, female   DOB: 07/16/1990, 23 y.o.   MRN:

## 2013-09-04 ENCOUNTER — Encounter (HOSPITAL_COMMUNITY): Payer: Self-pay | Admitting: *Deleted

## 2013-09-04 MED ORDER — FUSION PLUS PO CAPS: 1.0000 | ORAL_CAPSULE | Freq: Every day | ORAL | Status: DC

## 2013-09-04 MED ORDER — IBUPROFEN 600 MG PO TABS
600.0000 mg | ORAL_TABLET | Freq: Four times a day (QID) | ORAL | Status: DC | PRN
Start: 2013-09-04 — End: 2017-12-18

## 2013-09-04 MED ORDER — LABETALOL HCL 200 MG PO TABS: 200.0000 mg | ORAL_TABLET | Freq: Three times a day (TID) | ORAL | Status: DC

## 2013-09-04 MED ORDER — OXYCODONE-ACETAMINOPHEN 5-325 MG PO TABS: 1.0000 | ORAL_TABLET | ORAL | Status: DC | PRN

## 2013-09-04 NOTE — Progress Notes (Signed)
UR chart review completed.  

## 2013-09-04 NOTE — Progress Notes (Signed)
Subjective: Postpartum Day 3: Cesarean Delivery Patient reports tolerating PO, + flatus, + BM and no problems voiding.    Objective: Vital signs in last 24 hours: Temp:  [98.6 F (37 C)-99.2 F (37.3 C)] 98.7 F (37.1 C) (03/02 0630) Pulse Rate:  [78-100] 93 (03/02 0630) Resp:  [16-18] 18 (03/02 0630) BP: (134-157)/(63-97) 149/97 mmHg (03/02 0630)  Physical Exam:  General: alert and no distress Lochia: appropriate Uterine Fundus: firm Incision: healing well DVT Evaluation: No evidence of DVT seen on physical exam.   Recent Labs  09/01/13 1305 09/02/13 0525  HGB 10.0* 8.9*  HCT 30.8* 28.3*    Assessment/Plan: Status post Cesarean section. Doing well postoperatively.  Anemia.  Clinically stable.  Iron Rx. Discharge home with standard precautions and return to clinic in 2 weeks.  Candelario Steppe A 09/04/2013, 8:28 AM

## 2013-09-04 NOTE — Discharge Summary (Signed)
Obstetric Discharge Summary Reason for Admission: induction of labor Prenatal Procedures: NST and ultrasound Intrapartum Procedures: cesarean: low cervical, transverse Postpartum Procedures: none Complications-Operative and Postpartum: none Hemoglobin  Date Value Ref Range Status  09/02/2013 8.9* 12.0 - 15.0 g/dL Final     HCT  Date Value Ref Range Status  09/02/2013 28.3* 36.0 - 46.0 % Final    Physical Exam:  General: alert and no distress Lochia: appropriate Uterine Fundus: firm Incision: healing well DVT Evaluation: No evidence of DVT seen on physical exam.  Discharge Diagnoses: Term Pregnancy-delivered  Discharge Information: Date: 09/04/2013 Activity: pelvic rest Diet: routine Medications: PNV, Ibuprofen, Colace, Iron and Percocet Condition: stable Instructions: refer to practice specific booklet Discharge to: home Follow-up Information   Follow up with Monique [name],Monique A, MD. Schedule an appointment as soon as possible for [name] visit in 2 weeks.   Specialty:  Obstetrics and Gynecology   Contact information:   [address] [place] [address] [phone]       Newborn Data:   Monique [name], Monique [name] [[id number]]  Live born female  Birth Weight: 5 lb 6.1 oz (2441 g) APGAR: 8, 9   Monique [name], Monique [name] [[id number]][[id number]]  Live born female  Birth Weight: 5 lb 10.3 oz (2560 g) APGAR: 8, 9  Home with mother.  Monique [name] 09/04/2013, 8:35 AM

## 2013-09-04 NOTE — Discharge Instructions (Signed)
[facility] Ask any questions about feeding, diapering, and baby care before you leave the hospital. Ask again if you do not understand. Ask when you need to see the doctor again. There are several things you must have before your baby comes home.  Infant car seat.  Crib.  Do not let your baby sleep in a bed with you or anyone else.  If you do not have a bed for your baby, ask the doctor what you can use that will be safe for the baby to sleep in. Infant feeding supplies:  6 to 8 bottles (8 oz. size).  6 to 8 nipples.  Measuring cup.  Measuring tablespoon.  Bottle brush.  Sterilizer (or use any large pan or kettle with a lid).  Formula that contains iron.  A way to boil and cool water. Breastfeeding supplies:  Breast pump.  Nipple cream. Clothing:  24 to 36 cloth diapers and waterproof diaper covers or a box of disposable diapers. You may need as many as 10 to 12 diapers per day.  3 onesies (other clothing will depend on the time of year and the weather).  3 receiving blankets.  3 baby pajamas or gowns.  3 bibs. Bath equipment:  Mild soap.  Petroleum jelly. No baby oil or powder.  Soft cloth towel and wash cloth.  Cotton balls.  Separate bath basin for baby. Only sponge bathe until umbilical cord and circumcision are healed. Other supplies:  Thermometer and bulb syringe (ask the hospital to send them home with you). Ask your doctor about how you should take your baby's temperature.  One to two pacifiers. Prepare for an emergency:  Know how to get to the hospital and know where to admit your baby.  Put all doctor numbers near your house phone and in your cell phone if you have one. Prepare your family:  Talk with siblings about the baby coming home and how they feel about it.  Decide how you want to handle visitors and other family members.  Take offers for help with the baby. You will need time to adjust. Know when to call the doctor.   GET HELP RIGHT AWAY IF:  Your baby's temperature is greater than 100.4 F (38 C).  The softspot on your baby's head starts to bulge.  Your baby is crying with no tears or has no wet diapers for 6 hours.  Your baby has rapid breathing.  Your baby is not as alert. Document Released: 06/04/2008 Document Revised: 09/14/2011 Document Reviewed: 09/11/2010 [facility] Patient Information 2014 [place]. Cesarean Section (Postpartum Care) These discharge instructions provide you with general information on cesarean section (caesarean, cesarian, caesarian) and caring for yourself after you leave the hospital. Your caregiver may also give you specific instructions. Please read these instructions and refer to them in the next few weeks. If you have any questions regarding these instructions, you may call the nursing unit. If you have any problems after discharge, please call your doctor. If you are unable to reach your doctor, you should seek help at the nearest Emergency Department. ACTIVITY  Rest as much as possible the first two weeks at home.   When possible, have someone help you with your household activities and the new baby for 2 to 3 weeks.   Limit your housework and social activity. Increase your activity gradually as your strength returns.   Do not climb stairs more than two or three times a day.   Do not lift anything  heavier than your baby.   Follow your doctor's instructions about driving a car.   Limit wearing support panties or control-top hose, since relying on your own muscles helps strengthen them.   Ask your doctor about exercises.  NUTRITION  You may return to your usual diet.   Drink 6 to 8 glasses of fluid a day.   Eat a well-balanced diet. Include portions of food from the meat/protein, milk, fruit, vegetable and bread groups.   Keep taking your prenatal or multivitamins.  ELIMINATION You should return to your usual bowel function. If constipation is a  problem, you may take a mild laxative such as Milk of Magnesia with your caregivers permission. Gradually add fruit, vegetables and bran to your diet. Make sure to increase your fluids. HYGIENE You may shower, wash your hair and take tub baths unless your doctor tells you otherwise. Continue peri-care until your vaginal bleeding and discharge stops. Do not douche or use tampons until your caregiver says it is OK. FEVER If you feel feverish or have shaking chills, take your temperature. If your temperature is 101 F (38.3 C), or is 100.4 F (38 C) two times in a four hour period, call your doctor. The fever may indicate infection. If you call early, infection can be treated with medicines that kill germs (antibiotics). Hospitalization may be avoided. PAIN CONTROL You may still have mild discomfort. Only take over-the-counter or prescription medicine as directed by your caregiver. Do not take aspirin; it can cause bleeding. If the pain is not relieved by your medicine or becomes worse, call your caregiver. INCISION CARE Clean your cut (incision) gently with soap and water. If your caregiver says it is okay, leave the incision without a dressing unless it is draining or irritated. If you have small adhesive strips across the incision and they do not fall off within 7 days, carefully peel them off. Check the incision daily for increased redness, drainage, swelling or separation of skin. Call your caregiver if any of these happen. VAGINAL CARE You may have a vaginal discharge or bleeding for up to 6 weeks. If the vaginal discharge becomes bright red, bad smelling, heavy in amount, has blood clots or if you have burning or frequency when urinating, call your caregiver. SEXUAL INTERCOURSE It is best to follow your caregiver's advice about when you may safely resume sexual intercourse. Most women can begin to have intercourse two to three weeks after their baby's birth. You can become pregnant before you  have a period. If you decide to have sexual intercourse, you should use birth control if you do not want to become pregnant right away.  BREAST CARE If you are not breastfeeding and your breasts become tender, hard or leak milk, you may wear a firm fitting bra and apply ice to the breasts. If you are breastfeeding, wear a good support bra. Call your caregiver if you have breast pain, flu-like symptoms, fever or hardness and reddening of your breasts. POSTPARTUM BLUES After the excitement of having the baby goes away, you may commonly have a period of low spirits or blues." Discuss your feelings with your partner, family and friends. This may be caused by the changing hormone levels in your body. You may want to contact your caregiver if this is worrisome. SEEK MEDICAL CARE IF:  There is swelling, redness or increasing pain in the wound area.   Pus is coming from the wound.   You notice a bad smell from the wound or surgical  dressing.   You have pain, redness and swelling from the intravenous site.   The wound is breaking open (the edges are not staying together).   You feel dizzy or feel like fainting.   You develop pain or bleeding when you urinate.   You develop diarrhea.   You develop nausea and vomiting.   You develop abnormal vaginal discharge.   You develop a rash.   You have any type of abnormal reaction or develop an allergy to your medication.   You need stronger pain medication for your pain.  SEEK IMMEDIATE MEDICAL CARE:  You develop a temperature of 101 or higher.   You develop abdominal pain.   You develop chest pain.   You develop shortness of breath.   You pass out.   You develop pain, swelling or redness of your leg.   You develop heavy vaginal bleeding with or without blood clots.  Document Released: 03/14/2002 Document Re-Released: 12/10/2009 [facility] Patient Information 2011 [place], [place].

## 2013-09-07 ENCOUNTER — Telehealth: Payer: Self-pay | Admitting: *Deleted

## 2013-09-07 NOTE — Telephone Encounter (Signed)
[facility] BP 160/90 patient is taking Labetalol 200mg  3xd with no HA/Blurred vision reported. Per Dr Clearance CootsHarperEunice Blase- Debbie to check BP again tomorrow and patient is coming to office on Monday. Will get result tomorrow and make sure within acceptable level.

## 2013-09-11 ENCOUNTER — Encounter: Payer: Self-pay | Admitting: Obstetrics

## 2013-09-11 ENCOUNTER — Ambulatory Visit (INDEPENDENT_AMBULATORY_CARE_PROVIDER_SITE_OTHER): Payer: Medicaid Other | Admitting: Obstetrics

## 2013-09-11 VITALS — BP 178/108 | HR 71 | Temp 98.9°F | Ht 63.0 in | Wt 229.0 lb

## 2013-09-11 DIAGNOSIS — Z3009 Encounter for other general counseling and advice on contraception: Secondary | ICD-10-CM

## 2013-09-11 DIAGNOSIS — O165 Unspecified maternal hypertension, complicating the puerperium: Secondary | ICD-10-CM | POA: Insufficient documentation

## 2013-09-11 MED ORDER — TRIAMTERENE-HCTZ 37.5-25 MG PO CAPS
1.0000 | ORAL_CAPSULE | Freq: Every day | ORAL | Status: DC
Start: 2013-09-11 — End: 2020-07-02

## 2013-09-11 MED ORDER — AMLODIPINE BESYLATE 5 MG PO TABS: 5.0000 mg | ORAL_TABLET | Freq: Every day | ORAL | Status: DC

## 2013-09-11 NOTE — Progress Notes (Signed)
Subjective:     Monique [name] is a 23 y.o. female who presents for a postpartum visit. She is 1 week postpartum following a low cervical transverse Cesarean section. I have fully reviewed the prenatal and intrapartum course. The delivery was at 37.2 gestational weeks. Outcome: primary cesarean section, low transverse incision. Anesthesia: spinal. Postpartum course has been complicated by hypertension. Baby's course has been WNL. Baby is feeding by bottle - Similac Neosure. Bleeding thin lochia. Bowel function is normal. Bladder function is normal. Patient is not sexually active. Contraception method is abstinence. Postpartum depression screening: negative.  The following portions of the patient's history were reviewed and updated as appropriate: allergies, current medications, past family history, past medical history, past social history, past surgical history and problem list.  Review of Systems Pertinent items are noted in HPI.   Objective:     General:  alert and no distress PE:  Deferred   Assessment:    PIH.  Not optimally controlled BP but asymptomatically.  Counseling for contraception  Plan:    1. Contraception: Options discussed. 2. Norvasc and Dyazide Rx 3. Follow up in: 2 weeks or as needed.

## 2013-09-26 ENCOUNTER — Ambulatory Visit: Payer: Medicaid Other | Admitting: Obstetrics

## 2013-10-30 ENCOUNTER — Encounter: Payer: Self-pay | Admitting: Obstetrics

## 2013-10-30 ENCOUNTER — Ambulatory Visit (INDEPENDENT_AMBULATORY_CARE_PROVIDER_SITE_OTHER): Payer: Medicaid Other | Admitting: Obstetrics

## 2013-10-30 VITALS — BP 145/92 | HR 73 | Temp 97.3°F | Ht 63.0 in | Wt 229.0 lb

## 2013-10-30 DIAGNOSIS — Z30017 Encounter for initial prescription of implantable subdermal contraceptive: Secondary | ICD-10-CM

## 2013-10-30 DIAGNOSIS — Z3202 Encounter for pregnancy test, result negative: Secondary | ICD-10-CM

## 2013-10-30 LAB — POCT URINE PREGNANCY: PREG TEST UR: NEGATIVE

## 2013-10-30 NOTE — Progress Notes (Signed)
Nexplanon Procedure Note   PRE-OP DIAGNOSIS: desired long-term, reversible contraception  POST-OP DIAGNOSIS: Same  PROCEDURE: Nexplanon  placement Performing Provider: Coral Ceoharles Harper MD  Patient education prior to procedure, explained risk, benefits of Nexplanon, reviewed alternative options. Patient reported understanding. Gave consent to continue with procedure.   PROCEDURE:  Pregnancy Text :  Negative Site (check):      left arm         Sterile Preparation:   Betadinex3 Lot # [id number] / [id number] Expiration Date 10 / 2017  Insertion site was selected 8 - 10 cm from medial epicondyle and marked along with guiding site using sterile marker. Procedure area was prepped and draped in a sterile fashion. 1% Lidocaine 1.5 ml given prior to procedure. Nexplanon  was inserted subcutaneously.Needle was removed from the insertion site. Nexplanon capsule was palpated by provider and patient to assure satisfactory placement. Dressing applied.  Followup: The patient tolerated the procedure well without complications.  Standard post-procedure care is explained and return precautions are given.  Coral Ceoharles Harper MD

## 2013-11-14 ENCOUNTER — Ambulatory Visit: Payer: Medicaid Other | Admitting: Obstetrics

## 2014-05-07 ENCOUNTER — Encounter: Payer: Self-pay | Admitting: Obstetrics

## 2015-06-24 ENCOUNTER — Telehealth: Payer: Self-pay | Admitting: *Deleted

## 2015-06-25 NOTE — Telephone Encounter (Signed)
Patient thinks her Nexplanon is broken- appointment for assessment given

## 2015-06-26 ENCOUNTER — Ambulatory Visit: Payer: Medicaid Other | Admitting: Obstetrics

## 2015-07-02 ENCOUNTER — Ambulatory Visit: Payer: Self-pay | Admitting: Obstetrics

## 2015-09-11 IMAGING — US US OB FOLLOW-UP
1 series · 14 of 28 positions shown · non-contrast
Comparison: none

[Series 1: us ob follow-up · 0.26mm/px · 14 of 63 slices shown]
[im 3/63]
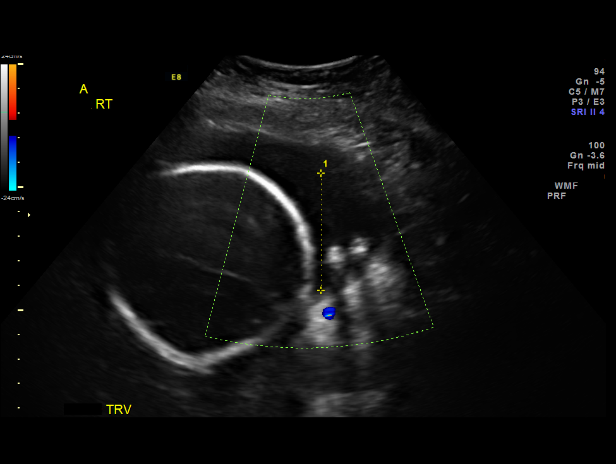
[im 7/63]
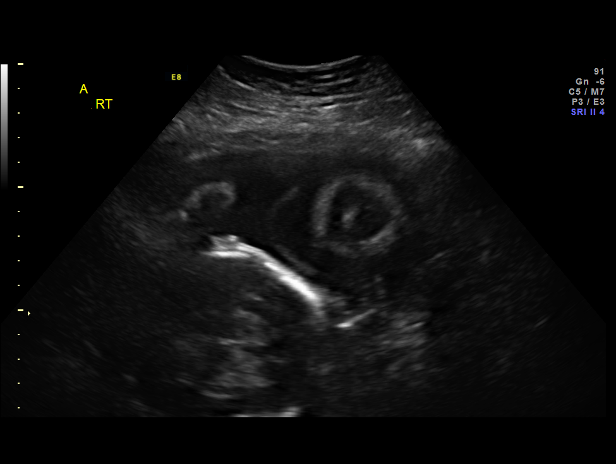
[im 12/63]
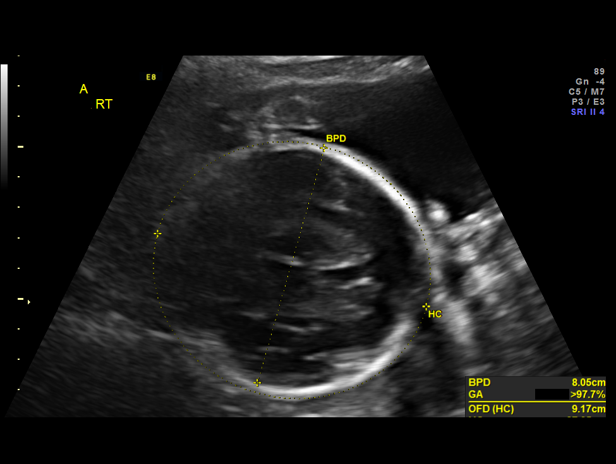
[im 17/63]
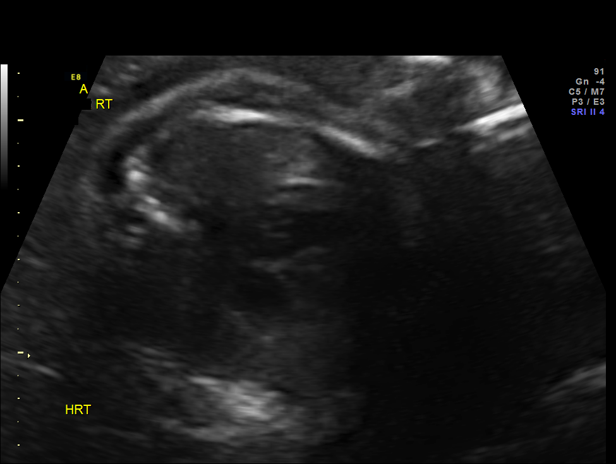
[im 21/63]
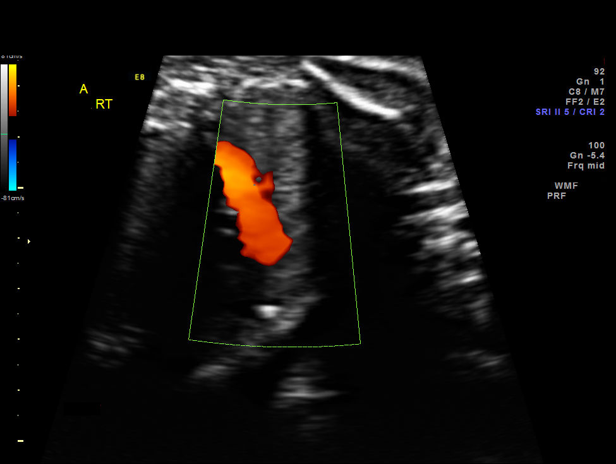
[im 26/63]
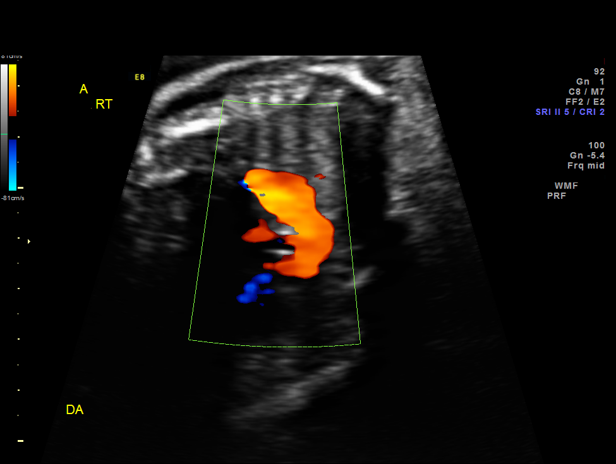
[im 30/63]
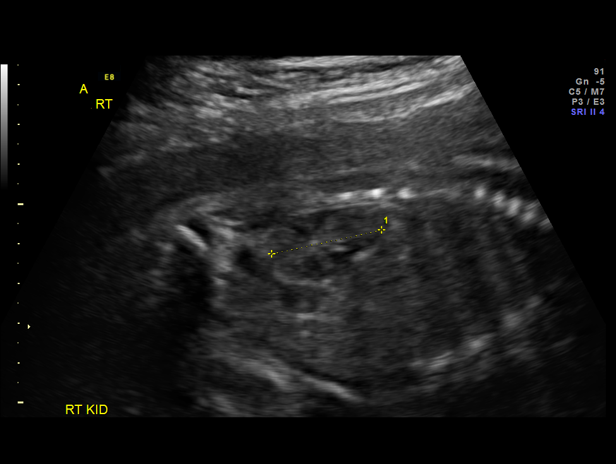
[im 35/63]
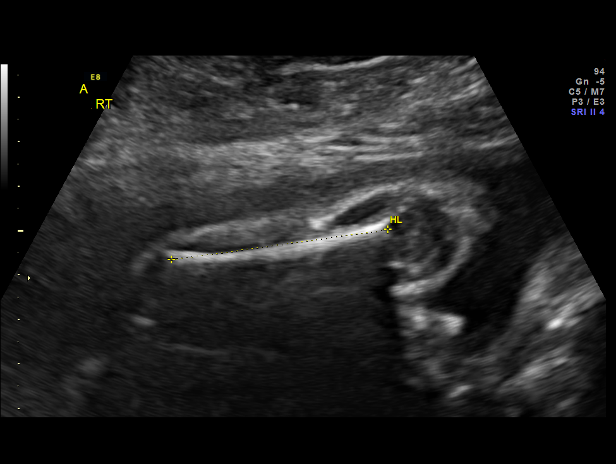
[im 40/63]
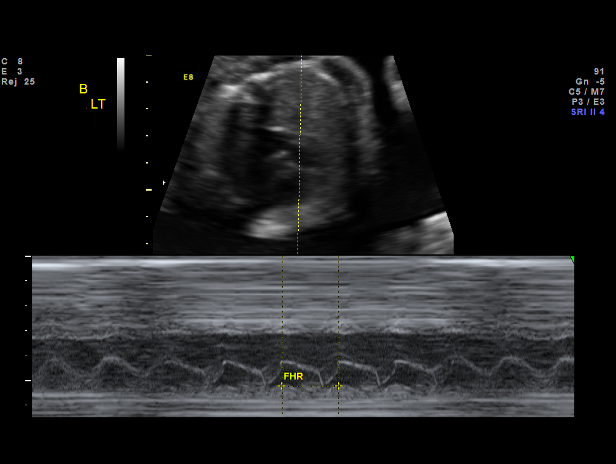
[im 44/63]
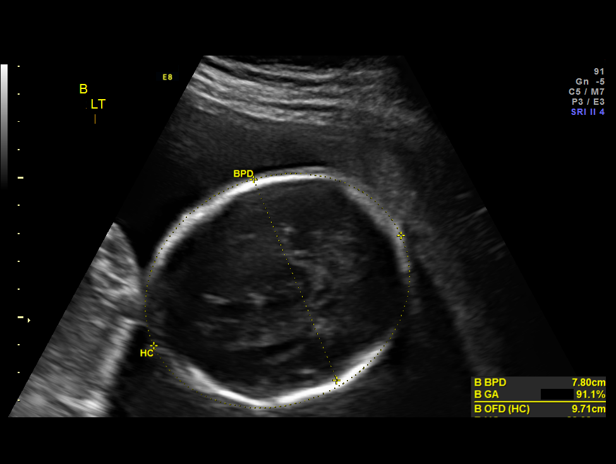
[im 49/63]
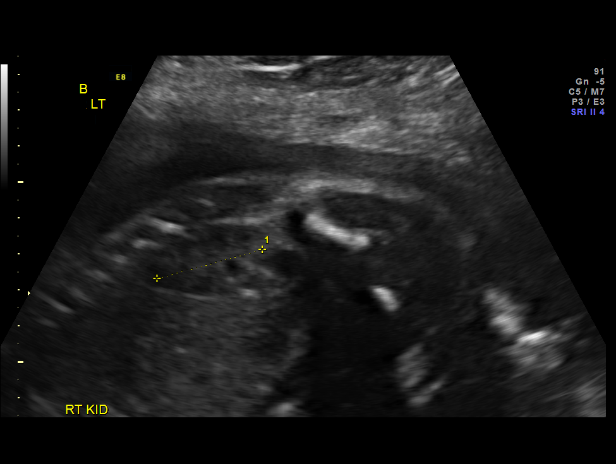
[im 53/63]
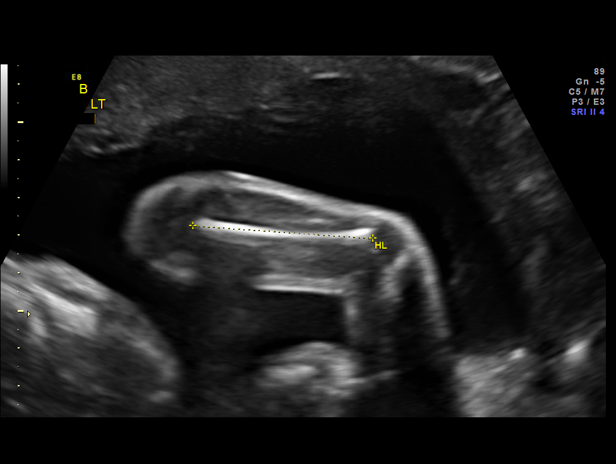
[im 58/63]
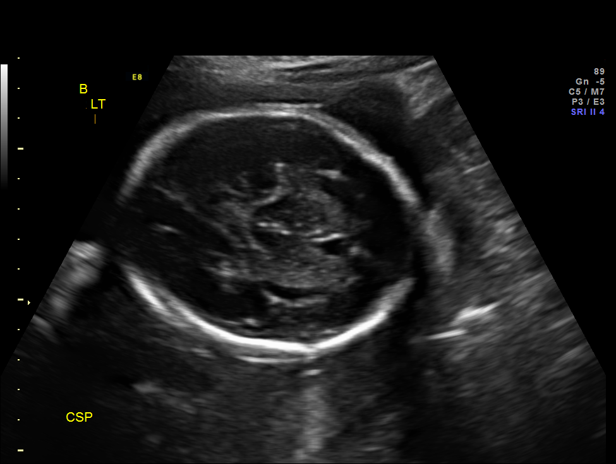
[im 63/63]
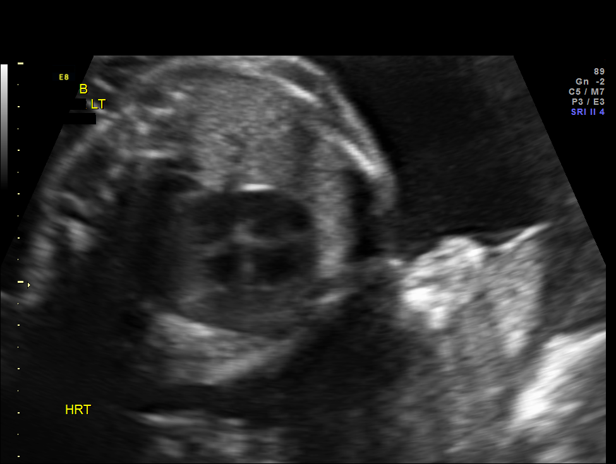

[14 of 28 positions shown; findings below may reference images not displayed]

OBSTETRICS REPORT
                      (Signed Final 07/07/2013 [DATE])

             BOND

Service(s) Provided

 US OB FOLLOW UP                                       76816.1
 US OB FOLLOW UP ADDL GEST                             76816.2
Indications

 Twin pregnancy antepartum: Di/Di
 Maternal obesity
 Asthma
Fetal Evaluation (Fetus A)

 Num Of Fetuses:    2
 Fetal Heart Rate:  145                          bpm
 Cardiac Activity:  Observed
 Fetal Lie:         Lower right fetus
 Presentation:      Cephalic
 Placenta:          Posterior, above cervical
                    os
 P. Cord            Previously Visualized
 Insertion:

 Membrane Desc:     Dividing
                    Membrane seen
                    - Dichorionic.

 Amniotic Fluid
 AFI FV:      Subjectively within normal limits
                                             Larg Pckt:     4.7  cm
Biometry (Fetus A)

 BPD:       80  mm     G. Age:  32w 1d                CI:         89.1   70 - 86
 OFD:     89.8  mm                                    FL/HC:      20.0   19.6 -

 HC:       273  mm     G. Age:  29w 6d       31  %    HC/AC:      1.14   0.99 -

 AC:     238.6  mm     G. Age:  28w 1d       15  %    FL/BPD:     68.4   71 - 87
 FL:      54.7  mm     G. Age:  28w 6d       24  %    FL/AC:      22.9   20 - 24
 HUM:     50.3  mm     G. Age:  29w 3d       50  %

 Est. FW:    9928  gm    2 lb 13 oz      42  %     FW Discordancy         5  %
Gestational Age (Fetus A)
 LMP:           32w 4d        Date:  11/21/12                 EDD:   08/28/13
 U/S Today:     29w 5d                                        EDD:   09/17/13
 Best:          29w 2d     Det. By:  U/S Fetus A (05/09/13)   EDD:   09/20/13
Anatomy (Fetus A)

 Cranium:          Appears normal         Aortic Arch:      Appears normal
 Fetal Cavum:      Appears normal         Ductal Arch:      Appears normal
 Ventricles:       Appears normal         Diaphragm:        Previously seen
 Choroid Plexus:   Previously seen        Stomach:          Appears normal
 Cerebellum:       Previously seen        Abdomen:          Appears normal
 Posterior Fossa:  Previously seen        Abdominal Wall:   Previously seen
 Nuchal Fold:      Not applicable (>20    Cord Vessels:     Previously seen
                   wks GA)
 Face:             Orbits and profile     Kidneys:          Appear normal
                   previously seen
 Lips:             Previously seen        Bladder:          Appears normal
 Palate:           Previously seen        Spine:            Previously seen
 Heart:            Appears normal         Lower             Previously seen
                   (4CH, axis, and        Extremities:
                   situs)
 RVOT:             Appears normal         Upper             Previously seen
                                          Extremities:
 LVOT:             Previously seen

 Other:  Fetus appears to be a male. Heels and 5th digit previously visualized.
         Nasal bone previously visualized. Technically difficult due to fetal
         position.

Fetal Evaluation (Fetus B)

 Num Of Fetuses:    2
 Fetal Heart Rate:  150                          bpm
 Cardiac Activity:  Observed
 Fetal Lie:         Upper right fetus
 Presentation:      Breech
 Placenta:          Anterior, above cervical os
 P. Cord            Previously Visualized
 Insertion:

 Membrane Desc:     Dividing
                    Membrane seen
                    - Dichorionic.

 Amniotic Fluid
 AFI FV:      Subjectively within normal limits
                                             Larg Pckt:     7.0  cm
Biometry (Fetus B)

 BPD:     78.3  mm     G. Age:  31w 3d                CI:         81.1   70 - 86
 OFD:     96.6  mm                                    FL/HC:      19.1   19.6 -

 HC:     279.7  mm     G. Age:  30w 4d       56  %    HC/AC:      1.12   0.99 -

 AC:     250.8  mm     G. Age:  29w 2d       44  %    FL/BPD:     68.1   71 - 87
 FL:      53.3  mm     G. Age:  28w 2d       13  %    FL/AC:      21.3   20 - 24
 HUM:     47.9  mm     G. Age:  28w 0d       23  %

 Est. FW:    1427  gm           3 lb     51  %     FW Discordancy      0 \ 5 %
Gestational Age (Fetus B)

 LMP:           32w 4d        Date:  11/21/12                 EDD:   08/28/13
 U/S Today:     29w 6d                                        EDD:   09/16/13
 Best:          29w 2d     Det. By:  U/S Fetus A (05/09/13)   EDD:   09/20/13
Anatomy (Fetus B)

 Cranium:          Appears normal         Aortic Arch:      Previously seen
 Fetal Cavum:      Appears normal         Ductal Arch:      Previously seen
 Ventricles:       Appears normal         Diaphragm:        Previously seen
 Choroid Plexus:   Previously seen        Stomach:          Appears normal
 Cerebellum:       Previously seen        Abdomen:          Appears normal
 Posterior Fossa:  Previously seen        Abdominal Wall:   Previously seen
 Nuchal Fold:      Not applicable (>20    Cord Vessels:     Previously seen
                   wks GA)
 Face:             Orbits previously      Kidneys:          Appear normal
                   seen
 Lips:             Previously seen        Bladder:          Appears normal
 Palate:           Not well visualized    Spine:            Appears normal
 Heart:            Appears normal         Lower             Previously seen
                   (4CH, axis, and        Extremities:
                   situs)
 RVOT:             Previously seen        Upper             Previously seen
                                          Extremities:
 LVOT:             Previously seen

 Other:  Fetus appears to be a male. Heels and 5th digit previously visualized.
         Technically difficult due to fetal position.
Cervix Uterus Adnexa

 Cervical Length:    3.1      cm

 Cervix:       Normal appearance by transabdominal scan.
 Left Ovary:    Not visualized.
 Right Ovary:   Within normal limits.

 Adnexa:     No abnormality visualized.
Impression

 There is an active dichorionic-diamniotic twin pregnancy at
 32w3d by biometry on today's routine anatomic re-
 examination.
 Twin A:
 -Active fetus
 -The biometry suggests a fetus with an EFW at the
 approximately 42nd% for gestational age.
 -Normal amniotic fluid volume
 -No dysmorphic features noted on today's images with
 limitations documented above

 Twin B:
 -Active fetus
 -The biometry suggests a fetus with an EFW at the
 approximately 51st% for gestational age.
 -Normal amniotic fluid volume
 -No dysmorphic features noted on today's images with
 limitations documented above

 Impressions:
 -Active di-di twin pair
 -Concordant growth (5% discordance)
 -Concordant amniotic fluid volume
Recommendations

 Follow up interval growth in 4 weeks.
 Consider initiation of antenatal testing around 32-34 weeks
 gestation in setting of dichorionic twin pair.

 DRILON with us.  Please do not hesitate to contact

## 2017-12-18 ENCOUNTER — Emergency Department (HOSPITAL_COMMUNITY): Payer: BLUE CROSS/BLUE SHIELD

## 2017-12-18 ENCOUNTER — Encounter (HOSPITAL_COMMUNITY): Payer: Self-pay | Admitting: *Deleted

## 2017-12-18 ENCOUNTER — Emergency Department (HOSPITAL_COMMUNITY)
Admission: EM | Admit: 2017-12-18 | Discharge: 2017-12-18 | Disposition: A | Discharge status: Home / Self Care | Payer: BLUE CROSS/BLUE SHIELD | Attending: Emergency Medicine | Admitting: Emergency Medicine

## 2017-12-18 DIAGNOSIS — S39012A Strain of muscle, fascia and tendon of lower back, initial encounter: Secondary | ICD-10-CM | POA: Insufficient documentation

## 2017-12-18 DIAGNOSIS — Z87891 Personal history of nicotine dependence: Secondary | ICD-10-CM | POA: Diagnosis not present

## 2017-12-18 DIAGNOSIS — R0789 Other chest pain: Secondary | ICD-10-CM | POA: Diagnosis not present

## 2017-12-18 DIAGNOSIS — Y939 Activity, unspecified: Secondary | ICD-10-CM | POA: Insufficient documentation

## 2017-12-18 DIAGNOSIS — Y929 Unspecified place or not applicable: Secondary | ICD-10-CM | POA: Insufficient documentation

## 2017-12-18 DIAGNOSIS — S3992XA Unspecified injury of lower back, initial encounter: Secondary | ICD-10-CM | POA: Diagnosis present

## 2017-12-18 DIAGNOSIS — J45909 Unspecified asthma, uncomplicated: Secondary | ICD-10-CM | POA: Insufficient documentation

## 2017-12-18 DIAGNOSIS — Y999 Unspecified external cause status: Secondary | ICD-10-CM | POA: Diagnosis not present

## 2017-12-18 DIAGNOSIS — M25512 Pain in left shoulder: Secondary | ICD-10-CM | POA: Insufficient documentation

## 2017-12-18 MED ORDER — IBUPROFEN 800 MG PO TABS: 800.0000 mg | ORAL_TABLET | Freq: Three times a day (TID) | ORAL | 0 refills | Status: DC

## 2017-12-18 MED ORDER — METHOCARBAMOL 500 MG PO TABS: 500.0000 mg | ORAL_TABLET | Freq: Four times a day (QID) | ORAL | 0 refills | Status: DC | PRN
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
## 2017-12-18 NOTE — Discharge Instructions (Signed)
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
## 2020-06-28 ENCOUNTER — Emergency Department (HOSPITAL_COMMUNITY)
Admission: EM | Admit: 2020-06-28 | Discharge: 2020-06-28 | Disposition: A | Discharge status: Home / Self Care | Payer: BC Managed Care – PPO | Attending: Emergency Medicine | Admitting: Emergency Medicine

## 2020-06-28 ENCOUNTER — Encounter (HOSPITAL_COMMUNITY): Payer: Self-pay | Admitting: *Deleted

## 2020-06-28 ENCOUNTER — Other Ambulatory Visit: Payer: Self-pay

## 2020-06-28 DIAGNOSIS — M25512 Pain in left shoulder: Secondary | ICD-10-CM | POA: Insufficient documentation

## 2020-06-28 DIAGNOSIS — Z79899 Other long term (current) drug therapy: Secondary | ICD-10-CM | POA: Diagnosis not present

## 2020-06-28 DIAGNOSIS — J45909 Unspecified asthma, uncomplicated: Secondary | ICD-10-CM | POA: Diagnosis not present

## 2020-06-28 DIAGNOSIS — M545 Low back pain, unspecified: Secondary | ICD-10-CM | POA: Diagnosis not present

## 2020-06-28 DIAGNOSIS — M542 Cervicalgia: Secondary | ICD-10-CM | POA: Diagnosis not present

## 2020-06-28 DIAGNOSIS — Z87891 Personal history of nicotine dependence: Secondary | ICD-10-CM | POA: Insufficient documentation

## 2020-06-28 MED ORDER — CYCLOBENZAPRINE HCL 10 MG PO TABS: 10.0000 mg | ORAL_TABLET | Freq: Two times a day (BID) | ORAL | 0 refills | Status: DC | PRN
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
## 2020-06-28 NOTE — Discharge Instructions (Addendum)
You have been seen here for back pain.  I prescribed a muscle relaxer, this can make you drowsy, I do not recommend operate heavy machinery or consume alcohol while on this medication.  I recommend taking this medication at nighttime.  I recommend taking over-the-counter pain medications like ibuprofen and/or Tylenol every 6 as needed.  Please follow dosage and on the back of bottle.  I also recommend applying heat to the area and stretching out the muscles as this will help decrease stiffness and pain.  I have given you information on exercises please follow.  Recommend follow-up with your PCP for further evaluation.  Come back to the emergency department if you develop chest pain, shortness of breath, severe abdominal pain, uncontrolled nausea, vomiting, diarrhea.

## 2020-07-02 ENCOUNTER — Emergency Department (HOSPITAL_COMMUNITY)
Admission: EM | Admit: 2020-07-02 | Discharge: 2020-07-02 | Disposition: A | Discharge status: Home / Self Care | Payer: BC Managed Care – PPO | Attending: Emergency Medicine | Admitting: Emergency Medicine

## 2020-07-02 ENCOUNTER — Encounter (HOSPITAL_COMMUNITY): Payer: Self-pay

## 2020-07-02 DIAGNOSIS — J45909 Unspecified asthma, uncomplicated: Secondary | ICD-10-CM | POA: Diagnosis not present

## 2020-07-02 DIAGNOSIS — Z79899 Other long term (current) drug therapy: Secondary | ICD-10-CM | POA: Diagnosis not present

## 2020-07-02 DIAGNOSIS — I10 Essential (primary) hypertension: Secondary | ICD-10-CM | POA: Insufficient documentation

## 2020-07-02 DIAGNOSIS — Z87891 Personal history of nicotine dependence: Secondary | ICD-10-CM | POA: Diagnosis not present

## 2020-07-02 DIAGNOSIS — I160 Hypertensive urgency: Secondary | ICD-10-CM | POA: Insufficient documentation

## 2020-07-02 LAB — CBC WITH DIFFERENTIAL/PLATELET
Abs Immature Granulocytes: 0.01 10*3/uL (ref 0.00–0.07)
Basophils Absolute: 0 10*3/uL (ref 0.0–0.1)
Basophils Relative: 0 %
Eosinophils Absolute: 0.2 10*3/uL (ref 0.0–0.5)
Eosinophils Relative: 3 %
HCT: 41.5 % (ref 36.0–46.0)
Hemoglobin: 13.8 g/dL (ref 12.0–15.0)
Immature Granulocytes: 0 %
Lymphocytes Relative: 33 %
Lymphs Abs: 2.4 10*3/uL (ref 0.7–4.0)
MCH: 31.2 pg (ref 26.0–34.0)
MCHC: 33.3 g/dL (ref 30.0–36.0)
MCV: 93.7 fL (ref 80.0–100.0)
Monocytes Absolute: 0.4 10*3/uL (ref 0.1–1.0)
Monocytes Relative: 6 %
Neutro Abs: 4.1 10*3/uL (ref 1.7–7.7)
Neutrophils Relative %: 58 %
Platelets: 220 10*3/uL (ref 150–400)
RBC: 4.43 MIL/uL (ref 3.87–5.11)
RDW: 13.3 % (ref 11.5–15.5)
WBC: 7.1 10*3/uL (ref 4.0–10.5)
nRBC: 0 % (ref 0.0–0.2)

## 2020-07-02 LAB — BASIC METABOLIC PANEL
Anion gap: 9 (ref 5–15)
BUN: 8 mg/dL (ref 6–20)
CO2: 26 mmol/L (ref 22–32)
Calcium: 8.2 mg/dL — ABNORMAL LOW (ref 8.9–10.3)
Chloride: 103 mmol/L (ref 98–111)
Creatinine, Ser: 0.71 mg/dL (ref 0.44–1.00)
GFR, Estimated: >60 mL/min (ref >60)
Glucose, Bld: 97 mg/dL (ref 70–99)
Potassium: 3.5 mmol/L (ref 3.5–5.1)
Sodium: 138 mmol/L (ref 135–145)

## 2020-07-02 MED ORDER — AMLODIPINE BESYLATE 5 MG PO TABS: 5.0000 mg | ORAL_TABLET | Freq: Every day | ORAL | 1 refills | Status: DC

## 2020-07-02 MED ORDER — AMLODIPINE BESYLATE 5 MG PO TABS
5.0000 mg | ORAL_TABLET | Freq: Once | ORAL | Status: AC
  Administered 2020-07-02: 5 mg via ORAL
  Filled 2020-07-02: qty 1

## 2020-07-02 NOTE — Discharge Instructions (Addendum)
It is very important to get a primary care physician.  Use the group listed or use the number listed to call for recommendations based on your insurance.  If you develop any other new or concerning symptoms such as severe headache, chest pain, vomiting, etc. and return to the ER or call 911.
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
## 2021-12-30 ENCOUNTER — Inpatient Hospital Stay (HOSPITAL_COMMUNITY)
Admission: AD | Admit: 2021-12-30 | Discharge: 2022-01-02 | DRG: 833 | Disposition: A | Discharge status: Home / Self Care | Payer: BC Managed Care – PPO | Attending: Family Medicine | Admitting: Family Medicine

## 2021-12-30 ENCOUNTER — Encounter (HOSPITAL_COMMUNITY): Payer: Self-pay | Admitting: Obstetrics and Gynecology

## 2021-12-30 DIAGNOSIS — Z3A36 36 weeks gestation of pregnancy: Secondary | ICD-10-CM | POA: Diagnosis not present

## 2021-12-30 DIAGNOSIS — H109 Unspecified conjunctivitis: Secondary | ICD-10-CM | POA: Diagnosis present

## 2021-12-30 DIAGNOSIS — O26893 Other specified pregnancy related conditions, third trimester: Secondary | ICD-10-CM | POA: Diagnosis not present

## 2021-12-30 DIAGNOSIS — O10919 Unspecified pre-existing hypertension complicating pregnancy, unspecified trimester: Secondary | ICD-10-CM | POA: Diagnosis not present

## 2021-12-30 DIAGNOSIS — R7989 Other specified abnormal findings of blood chemistry: Secondary | ICD-10-CM | POA: Diagnosis present

## 2021-12-30 DIAGNOSIS — O10013 Pre-existing essential hypertension complicating pregnancy, third trimester: Principal | ICD-10-CM | POA: Diagnosis present

## 2021-12-30 DIAGNOSIS — O0930 Supervision of pregnancy with insufficient antenatal care, unspecified trimester: Secondary | ICD-10-CM

## 2021-12-30 DIAGNOSIS — O34219 Maternal care for unspecified type scar from previous cesarean delivery: Secondary | ICD-10-CM | POA: Diagnosis present

## 2021-12-30 DIAGNOSIS — O210 Mild hyperemesis gravidarum: Secondary | ICD-10-CM | POA: Diagnosis not present

## 2021-12-30 DIAGNOSIS — E876 Hypokalemia: Secondary | ICD-10-CM | POA: Diagnosis present

## 2021-12-30 DIAGNOSIS — Z363 Encounter for antenatal screening for malformations: Secondary | ICD-10-CM | POA: Diagnosis not present

## 2021-12-30 DIAGNOSIS — O99891 Other specified diseases and conditions complicating pregnancy: Secondary | ICD-10-CM | POA: Diagnosis present

## 2021-12-30 DIAGNOSIS — Z3A37 37 weeks gestation of pregnancy: Secondary | ICD-10-CM | POA: Diagnosis not present

## 2021-12-30 DIAGNOSIS — Z3A35 35 weeks gestation of pregnancy: Secondary | ICD-10-CM | POA: Diagnosis not present

## 2021-12-30 DIAGNOSIS — O99283 Endocrine, nutritional and metabolic diseases complicating pregnancy, third trimester: Secondary | ICD-10-CM | POA: Diagnosis not present

## 2021-12-30 LAB — URINALYSIS, ROUTINE W REFLEX MICROSCOPIC
Bilirubin Urine: NEGATIVE
Glucose, UA: NEGATIVE mg/dL
Hgb urine dipstick: NEGATIVE
Ketones, ur: 80 mg/dL — AB
Leukocytes,Ua: NEGATIVE
Nitrite: NEGATIVE
Protein, ur: 100 mg/dL — AB
Specific Gravity, Urine: 1.015 (ref 1.005–1.030)
pH: 7 (ref 5.0–8.0)

## 2021-12-30 LAB — POCT PREGNANCY, URINE: Preg Test, Ur: POSITIVE — AB

## 2021-12-30 NOTE — MAU Note (Signed)
.  Monique [name] is a 31 y.o. at Unknown here in MAU reporting n/v for several wks but worse today. Does not know LMP but thinks maybe March. Irreg periods. Having a lot of pelvic pressure. Also eyes are very red LMP: unknow Onset of complaint: couple wks Pain score: 6 Vitals:   12/30/21 2256  Pulse: 87  Resp: 17  Temp: 98.4 F (36.9 C)  SpO2: 99%     FHT:n/a Lab orders placed from triage:

## 2021-12-31 ENCOUNTER — Inpatient Hospital Stay (HOSPITAL_BASED_OUTPATIENT_CLINIC_OR_DEPARTMENT_OTHER): Payer: BC Managed Care – PPO

## 2021-12-31 ENCOUNTER — Inpatient Hospital Stay (HOSPITAL_COMMUNITY): Payer: BC Managed Care – PPO

## 2021-12-31 ENCOUNTER — Encounter (HOSPITAL_COMMUNITY): Payer: Self-pay | Admitting: Obstetrics and Gynecology

## 2021-12-31 ENCOUNTER — Other Ambulatory Visit: Payer: Self-pay

## 2021-12-31 DIAGNOSIS — O210 Mild hyperemesis gravidarum: Secondary | ICD-10-CM

## 2021-12-31 DIAGNOSIS — Z3A36 36 weeks gestation of pregnancy: Secondary | ICD-10-CM | POA: Diagnosis not present

## 2021-12-31 DIAGNOSIS — Z363 Encounter for antenatal screening for malformations: Secondary | ICD-10-CM | POA: Diagnosis not present

## 2021-12-31 DIAGNOSIS — Z3A35 35 weeks gestation of pregnancy: Secondary | ICD-10-CM

## 2021-12-31 DIAGNOSIS — O99283 Endocrine, nutritional and metabolic diseases complicating pregnancy, third trimester: Secondary | ICD-10-CM | POA: Diagnosis present

## 2021-12-31 DIAGNOSIS — O34219 Maternal care for unspecified type scar from previous cesarean delivery: Secondary | ICD-10-CM

## 2021-12-31 DIAGNOSIS — O10013 Pre-existing essential hypertension complicating pregnancy, third trimester: Secondary | ICD-10-CM | POA: Diagnosis present

## 2021-12-31 DIAGNOSIS — O26893 Other specified pregnancy related conditions, third trimester: Secondary | ICD-10-CM

## 2021-12-31 DIAGNOSIS — E876 Hypokalemia: Secondary | ICD-10-CM | POA: Diagnosis present

## 2021-12-31 DIAGNOSIS — O10919 Unspecified pre-existing hypertension complicating pregnancy, unspecified trimester: Secondary | ICD-10-CM | POA: Diagnosis not present

## 2021-12-31 DIAGNOSIS — Z3A37 37 weeks gestation of pregnancy: Secondary | ICD-10-CM | POA: Diagnosis not present

## 2021-12-31 DIAGNOSIS — H109 Unspecified conjunctivitis: Secondary | ICD-10-CM | POA: Diagnosis present

## 2021-12-31 DIAGNOSIS — R7989 Other specified abnormal findings of blood chemistry: Secondary | ICD-10-CM | POA: Diagnosis present

## 2021-12-31 DIAGNOSIS — O99891 Other specified diseases and conditions complicating pregnancy: Secondary | ICD-10-CM | POA: Diagnosis present

## 2021-12-31 LAB — WET PREP, GENITAL
Clue Cells Wet Prep HPF POC: NONE SEEN
Sperm: NONE SEEN
Trich, Wet Prep: NONE SEEN
WBC, Wet Prep HPF POC: <10 (ref <10)
Yeast Wet Prep HPF POC: NONE SEEN

## 2021-12-31 LAB — COMPREHENSIVE METABOLIC PANEL
ALT: 25 U/L (ref 0–44)
ALT: 29 U/L (ref 0–44)
AST: 38 U/L (ref 15–41)
AST: 40 U/L (ref 15–41)
Albumin: 2.2 g/dL — ABNORMAL LOW (ref 3.5–5.0)
Albumin: 2.8 g/dL — ABNORMAL LOW (ref 3.5–5.0)
Alkaline Phosphatase: 145 U/L — ABNORMAL HIGH (ref 38–126)
Alkaline Phosphatase: 183 U/L — ABNORMAL HIGH (ref 38–126)
Anion gap: 16 — ABNORMAL HIGH (ref 5–15)
Anion gap: 17 — ABNORMAL HIGH (ref 5–15)
BUN: <5 mg/dL — ABNORMAL LOW (ref 6–20)
BUN: <5 mg/dL — ABNORMAL LOW (ref 6–20)
CO2: 22 mmol/L (ref 22–32)
CO2: 23 mmol/L (ref 22–32)
Calcium: 8.2 mg/dL — ABNORMAL LOW (ref 8.9–10.3)
Calcium: 8.8 mg/dL — ABNORMAL LOW (ref 8.9–10.3)
Chloride: 100 mmol/L (ref 98–111)
Chloride: 103 mmol/L (ref 98–111)
Creatinine, Ser: 0.83 mg/dL (ref 0.44–1.00)
Creatinine, Ser: 0.86 mg/dL (ref 0.44–1.00)
GFR, Estimated: >60 mL/min (ref >60)
GFR, Estimated: >60 mL/min (ref >60)
Glucose, Bld: 91 mg/dL (ref 70–99)
Glucose, Bld: 98 mg/dL (ref 70–99)
Potassium: 2.1 mmol/L — CL (ref 3.5–5.1)
Potassium: 2.3 mmol/L — CL (ref 3.5–5.1)
Sodium: 140 mmol/L (ref 135–145)
Sodium: 141 mmol/L (ref 135–145)
Total Bilirubin: 0.8 mg/dL (ref 0.3–1.2)
Total Bilirubin: 0.9 mg/dL (ref 0.3–1.2)
Total Protein: 5.8 g/dL — ABNORMAL LOW (ref 6.5–8.1)
Total Protein: 7.2 g/dL (ref 6.5–8.1)

## 2021-12-31 LAB — RAPID HIV SCREEN (HIV 1/2 AB+AG)
HIV 1/2 Antibodies: NONREACTIVE
HIV-1 P24 Antigen - HIV24: NONREACTIVE

## 2021-12-31 LAB — PROTEIN / CREATININE RATIO, URINE
Creatinine, Urine: 345.95 mg/dL
Protein Creatinine Ratio: 0.23 mg/mg{Cre} — ABNORMAL HIGH (ref 0.00–0.15)
Total Protein, Urine: 79 mg/dL

## 2021-12-31 LAB — HEPATITIS B SURFACE ANTIGEN: Hepatitis B Surface Ag: NONREACTIVE

## 2021-12-31 LAB — CBC
HCT: 32.1 % — ABNORMAL LOW (ref 36.0–46.0)
HCT: 37.9 % (ref 36.0–46.0)
Hemoglobin: 11.1 g/dL — ABNORMAL LOW (ref 12.0–15.0)
Hemoglobin: 13.1 g/dL (ref 12.0–15.0)
MCH: 29.9 pg (ref 26.0–34.0)
MCH: 29.9 pg (ref 26.0–34.0)
MCHC: 34.6 g/dL (ref 30.0–36.0)
MCHC: 34.6 g/dL (ref 30.0–36.0)
MCV: 86.5 fL (ref 80.0–100.0)
MCV: 86.5 fL (ref 80.0–100.0)
Platelets: 369 10*3/uL (ref 150–400)
Platelets: 442 10*3/uL — ABNORMAL HIGH (ref 150–400)
RBC: 3.71 MIL/uL — ABNORMAL LOW (ref 3.87–5.11)
RBC: 4.38 MIL/uL (ref 3.87–5.11)
RDW: 17.2 % — ABNORMAL HIGH (ref 11.5–15.5)
RDW: 17.2 % — ABNORMAL HIGH (ref 11.5–15.5)
WBC: 10 10*3/uL (ref 4.0–10.5)
WBC: 11.7 10*3/uL — ABNORMAL HIGH (ref 4.0–10.5)
nRBC: 0 % (ref 0.0–0.2)
nRBC: 0 % (ref 0.0–0.2)

## 2021-12-31 LAB — GC/CHLAMYDIA PROBE AMP (~~LOC~~) NOT AT ARMC
Chlamydia: NEGATIVE
Comment: NEGATIVE
Comment: NORMAL
Neisseria Gonorrhea: NEGATIVE

## 2021-12-31 LAB — TYPE AND SCREEN
ABO/RH(D): B POS
Antibody Screen: NEGATIVE

## 2021-12-31 LAB — TSH: TSH: 1.955 u[IU]/mL (ref 0.350–4.500)

## 2021-12-31 LAB — RPR: RPR Ser Ql: NONREACTIVE

## 2021-12-31 LAB — GROUP B STREP BY PCR: Group B strep by PCR: POSITIVE — AB

## 2021-12-31 LAB — OB RESULTS CONSOLE HIV ANTIBODY (ROUTINE TESTING): HIV: NONREACTIVE

## 2021-12-31 LAB — URIC ACID: Uric Acid, Serum: 7.7 mg/dL — ABNORMAL HIGH (ref 2.5–7.1)

## 2021-12-31 LAB — HCG, QUANTITATIVE, PREGNANCY: hCG, Beta Chain, Quant, S: 4035 m[IU]/mL — ABNORMAL HIGH (ref <5)

## 2021-12-31 MED ORDER — SODIUM CHLORIDE 0.9% FLUSH
3.0000 mL | Freq: Two times a day (BID) | INTRAVENOUS | Status: DC
  Administered 2021-12-31 – 2022-01-01 (×2): 3 mL via INTRAVENOUS

## 2021-12-31 MED ORDER — METOCLOPRAMIDE HCL 5 MG/ML IJ SOLN
10.0000 mg | Freq: Once | INTRAMUSCULAR | Status: AC
  Administered 2021-12-31: 10 mg via INTRAVENOUS
  Filled 2021-12-31: qty 2

## 2021-12-31 MED ORDER — HYDRALAZINE HCL 20 MG/ML IJ SOLN: 10.0000 mg | INTRAMUSCULAR | Status: DC | PRN

## 2021-12-31 MED ORDER — DIPHENHYDRAMINE HCL 50 MG/ML IJ SOLN
25.0000 mg | Freq: Once | INTRAMUSCULAR | Status: AC
  Administered 2021-12-31: 25 mg via INTRAVENOUS
  Filled 2021-12-31: qty 1

## 2021-12-31 MED ORDER — SODIUM CHLORIDE 0.9% FLUSH
3.0000 mL | Freq: Two times a day (BID) | INTRAVENOUS | Status: DC
  Administered 2022-01-02: 3 mL via INTRAVENOUS

## 2021-12-31 MED ORDER — LACTATED RINGERS IV SOLN: INTRAVENOUS | Status: DC

## 2021-12-31 MED ORDER — POTASSIUM CHLORIDE CRYS ER 20 MEQ PO TBCR
40.0000 meq | EXTENDED_RELEASE_TABLET | Freq: Three times a day (TID) | ORAL | Status: DC
  Administered 2021-12-31 – 2022-01-02 (×7): 40 meq via ORAL
  Filled 2021-12-31 (×8): qty 2

## 2021-12-31 MED ORDER — ACETAMINOPHEN 325 MG PO TABS: 650.0000 mg | ORAL_TABLET | ORAL | Status: DC | PRN

## 2021-12-31 MED ORDER — POTASSIUM CHLORIDE 10 MEQ/100ML IV SOLN
10.0000 meq | INTRAVENOUS | Status: AC
  Administered 2021-12-31 (×4): 10 meq via INTRAVENOUS
  Filled 2021-12-31 (×4): qty 100

## 2021-12-31 MED ORDER — PRENATAL MULTIVITAMIN CH
1.0000 | ORAL_TABLET | Freq: Every day | ORAL | Status: DC
  Administered 2021-12-31 – 2022-01-02 (×3): 1 via ORAL
  Filled 2021-12-31 (×3): qty 1

## 2021-12-31 MED ORDER — CALCIUM CARBONATE ANTACID 500 MG PO CHEW
2.0000 | CHEWABLE_TABLET | ORAL | Status: DC | PRN
  Administered 2021-12-31 (×2): 400 mg via ORAL
  Filled 2021-12-31 (×2): qty 2

## 2021-12-31 MED ORDER — SODIUM CHLORIDE 0.9 % IV SOLN
250.0000 mL | INTRAVENOUS | Status: DC | PRN
Start: 2021-12-31 — End: 2022-01-02
  Administered 2022-01-01 – 2022-01-02 (×4): 250 mL via INTRAVENOUS

## 2021-12-31 MED ORDER — ZOLPIDEM TARTRATE 5 MG PO TABS: 5.0000 mg | ORAL_TABLET | Freq: Every evening | ORAL | Status: DC | PRN

## 2021-12-31 MED ORDER — LABETALOL HCL 200 MG PO TABS
400.0000 mg | ORAL_TABLET | Freq: Three times a day (TID) | ORAL | Status: DC
  Administered 2021-12-31 – 2022-01-02 (×7): 400 mg via ORAL
  Filled 2021-12-31 (×7): qty 2

## 2021-12-31 MED ORDER — LABETALOL HCL 5 MG/ML IV SOLN: 40.0000 mg | INTRAVENOUS | Status: DC | PRN

## 2021-12-31 MED ORDER — LABETALOL HCL 200 MG PO TABS
200.0000 mg | ORAL_TABLET | Freq: Two times a day (BID) | ORAL | Status: DC
  Administered 2021-12-31 (×2): 200 mg via ORAL
  Filled 2021-12-31: qty 1
  Filled 2021-12-31: qty 2

## 2021-12-31 MED ORDER — SCOPOLAMINE 1 MG/3DAYS TD PT72
1.0000 | MEDICATED_PATCH | Freq: Once | TRANSDERMAL | Status: DC
  Administered 2021-12-31: 1.5 mg via TRANSDERMAL
  Filled 2021-12-31: qty 1

## 2021-12-31 MED ORDER — LABETALOL HCL 5 MG/ML IV SOLN: 80.0000 mg | INTRAVENOUS | Status: DC | PRN

## 2021-12-31 MED ORDER — PYRIDOXINE HCL 100 MG/ML IJ SOLN
100.0000 mg | Freq: Once | INTRAMUSCULAR | Status: AC
  Administered 2021-12-31: 100 mg via INTRAVENOUS
  Filled 2021-12-31: qty 1

## 2021-12-31 MED ORDER — LABETALOL HCL 5 MG/ML IV SOLN
20.0000 mg | INTRAVENOUS | Status: DC | PRN
  Administered 2021-12-31 (×2): 20 mg via INTRAVENOUS
  Filled 2021-12-31 (×2): qty 4

## 2021-12-31 MED ORDER — SODIUM CHLORIDE 0.9% FLUSH
3.0000 mL | INTRAVENOUS | Status: DC | PRN
Start: 2021-12-31 — End: 2022-01-02

## 2021-12-31 MED ORDER — DOCUSATE SODIUM 100 MG PO CAPS
100.0000 mg | ORAL_CAPSULE | Freq: Every day | ORAL | Status: DC
  Administered 2022-01-01 – 2022-01-02 (×2): 100 mg via ORAL
  Filled 2021-12-31 (×2): qty 1

## 2021-12-31 MED ORDER — POTASSIUM CHLORIDE CRYS ER 20 MEQ PO TBCR
40.0000 meq | EXTENDED_RELEASE_TABLET | Freq: Once | ORAL | Status: AC
  Administered 2021-12-31: 40 meq via ORAL
  Filled 2021-12-31: qty 2

## 2021-12-31 NOTE — Progress Notes (Signed)
Patient ID: Monique [name], female   DOB: 1990/09/05, 31 y.o.   MRN: [id number]  Reviewed prior notes and patient with BPs in the 170/100 range outside of pregnancy x 4 in 2015, 2019, 2021 x 2. Has seen PCP and on BP meds, but patient does not take them. Poorly dated. Discussed with MFM and they agree to delivery at 37 weeks if we can control BP. Offered this to patient and she does not want to remain in house for this amount of time. Will increase labetalol to 400 mg tid and if BP is better controlled in the am and labs remain normal, can consider scheduling RCS and BTL at 37 weeks with outpt. Mgmt with BP checks and labs at least once prior to delivery.

## 2021-12-31 NOTE — Progress Notes (Signed)
CRITICAL VALUE STICKER  CRITICAL VALUE:  Potassium level 2.1  RECEIVER (on-site recipient of call):   DATE & TIME NOTIFIED: 12/31/2021 @ 0836  MESSENGER (representative from lab):  MD NOTIFIED: Dr. Shawnie Pons  TIME OF NOTIFICATION: [phone]  RESPONSE:  Aware and will address

## 2021-12-31 NOTE — H&P (Signed)
FACULTY PRACTICE ANTEPARTUM ADMISSION HISTORY AND PHYSICAL NOTE   History of Present Illness: Monique [name] is a 31 y.o. [id number] at [redacted]w[redacted]d by 36w [place] admitted for rule out SIPE.  She presented with emesis for the last 1.5 hours. She denies HA/BV/RUQ pain. She had not felt FM and was not aware she was pregnant.   Patient reports uterine contraction  activity as none. Patient reports  vaginal bleeding as none. Patient describes fluid per vagina as None. Fetal presentation is cephalic.  Patient Active Problem List   Diagnosis Date Noted   Unspecified hypertension, postpartum condition or complication 09/11/2013   GERD without esophagitis 08/15/2013    Past Medical History:  Diagnosis Date   Asthma     Past Surgical History:  Procedure Laterality Date   CESAREAN SECTION N/A 09/01/2013   Procedure: CESAREAN SECTION;  Surgeon: Antionette Char, MD;  Location: [facility];  Service: Obstetrics;  Laterality: N/A;   NO PAST SURGERIES      OB History  Gravida Para Term Preterm AB Living  3 1 1   1 2   SAB IAB Ectopic Multiple Live Births        1 2    # Outcome Date GA Lbr Len/2nd Weight Sex Delivery Anes PTL Lv  3 Current           2A Term 09/01/13 [redacted]w[redacted]d  2441 g M CS-LTranv Spinal  LIV  2B Term 09/01/13 [redacted]w[redacted]d  2560 g M CS-LTranv Spinal  LIV  1 AB 09/26/12            Social History   Socioeconomic History   Marital status: Single    Spouse name: Not on file   Number of children: Not on file   Years of education: Not on file   Highest education level: Not on file  Occupational History   Not on file  Tobacco Use   Smoking status: Former    Types: Cigars   Smokeless tobacco: Never  Vaping Use   Vaping Use: Never used  Substance and Sexual Activity   Alcohol use: No   Drug use: No   Sexual activity: Yes    Partners: Male  Other Topics Concern   Not on file  Social History Narrative   Not on file   Social Determinants of Health   Financial Resource Strain: Not  on file  Food Insecurity: Not on file  Transportation Needs: Not on file  Physical Activity: Not on file  Stress: Not on file  Social Connections: Not on file    Family History  Problem Relation Age of Onset   Hypertension Mother    Diabetes Mother    Hypertension Father    Diabetes Father     No Known Allergies  Medications Prior to Admission  Medication Sig Dispense Refill Last Dose   amLODipine (NORVASC) 5 MG tablet Take 1 tablet (5 mg total) by mouth daily. (Patient not taking: Reported on 12/31/2021) 30 tablet 1 Not Taking   cyclobenzaprine (FLEXERIL) 10 MG tablet Take 1 tablet (10 mg total) by mouth 2 (two) times daily as needed for muscle spasms. (Patient not taking: Reported on 12/31/2021) 20 tablet 0 Not Taking   ibuprofen (ADVIL,MOTRIN) 800 MG tablet Take 1 tablet (800 mg total) by mouth 3 (three) times daily. (Patient not taking: Reported on 12/31/2021) 21 tablet 0 Not Taking   Prenatal Vit-Fe Fumarate-FA (PRENATAL MULTIVITAMIN) TABS tablet Take 1 tablet by mouth daily at 12 noon. (Patient not taking: Reported on 12/31/2021)  Not Taking    Review of Systems - Negative except as noted HPI  Vitals:  BP (!) 163/99   Pulse 91   Temp 98.4 F (36.9 C)   Resp 17   Ht 5\' 4"  (1.626 m)   Wt 99.8 kg   LMP  (LMP Unknown)   SpO2 99%   BMI 37.76 kg/m  Physical Examination: CONSTITUTIONAL: Well-developed, well-nourished female in no acute distress.  HENT:  Normocephalic, atraumatic, External right and left ear normal. Oropharynx is clear and moist EYES: Conjunctivae and EOM are normal. Pupils are equal, round, and reactive to light. No scleral icterus.  NECK: Normal range of motion, supple, no masses SKIN: Skin is warm and dry. No rash noted. Not diaphoretic. No erythema. No pallor. NEUROLOGIC: Alert and oriented to person, place, and time. Normal reflexes, muscle tone coordination. No cranial nerve deficit noted. PSYCHIATRIC: Normal mood and affect. Normal behavior. Normal  judgment and thought content. CARDIOVASCULAR: Normal heart rate noted, regular rhythm RESPIRATORY: Effort and breath sounds normal, no problems with respiration noted ABDOMEN: Soft, nontender, nondistended, gravid. MUSCULOSKELETAL: Normal range of motion. No edema and no tenderness. 2+ distal pulses.  Cervix: no examined Membranes:intact Fetal Monitoring:Baseline: 130 bpm, Variability: Good {> 6 bpm), Accelerations: Reactive, and Decelerations: Absent Tocometer: Flat  Labs:  Results for orders placed or performed during the hospital encounter of 12/30/21 (from the past 24 hour(s))  Urinalysis, Routine w reflex microscopic Urine, Clean Catch   Collection Time: 12/30/21 10:37 PM  Result Value Ref Range   Color, Urine AMBER (A) YELLOW   APPearance HAZY (A) CLEAR   Specific Gravity, Urine 1.015 1.005 - 1.030   pH 7.0 5.0 - 8.0   Glucose, UA NEGATIVE NEGATIVE mg/dL   Hgb urine dipstick NEGATIVE NEGATIVE   Bilirubin Urine NEGATIVE NEGATIVE   Ketones, ur 80 (A) NEGATIVE mg/dL   Protein, ur 01/01/22 (A) NEGATIVE mg/dL   Nitrite NEGATIVE NEGATIVE   Leukocytes,Ua NEGATIVE NEGATIVE   RBC / HPF 0-5 0 - 5 RBC/hpf   WBC, UA 0-5 0 - 5 WBC/hpf   Bacteria, UA RARE (A) NONE SEEN   Squamous Epithelial / LPF 11-20 0 - 5   Mucus PRESENT   Protein / creatinine ratio, urine   Collection Time: 12/30/21 10:37 PM  Result Value Ref Range   Creatinine, Urine 345.95 mg/dL   Total Protein, Urine 79 mg/dL   Protein Creatinine Ratio 0.23 (H) 0.00 - 0.15 mg/mg[Cre]  Pregnancy, urine POC   Collection Time: 12/30/21 10:45 PM  Result Value Ref Range   Preg Test, Ur POSITIVE (A) NEGATIVE  CBC   Collection Time: 12/30/21 11:50 PM  Result Value Ref Range   WBC 11.7 (H) 4.0 - 10.5 K/uL   RBC 4.38 3.87 - 5.11 MIL/uL   Hemoglobin 13.1 12.0 - 15.0 g/dL   HCT 01/01/22 46.9 - 62.9 %   MCV 86.5 80.0 - 100.0 fL   MCH 29.9 26.0 - 34.0 pg   MCHC 34.6 30.0 - 36.0 g/dL   RDW 52.8 (H) 41.3 - 24.4 %   Platelets 442 (H) 150 -  400 K/uL   nRBC 0.0 0.0 - 0.2 %  Comprehensive metabolic panel   Collection Time: 12/30/21 11:50 PM  Result Value Ref Range   Sodium 140 135 - 145 mmol/L   Potassium 2.3 (LL) 3.5 - 5.1 mmol/L   Chloride 100 98 - 111 mmol/L   CO2 23 22 - 32 mmol/L   Glucose, Bld 98 70 - 99 mg/dL  BUN <5 (L) 6 - 20 mg/dL   Creatinine, Ser 3.79 0.44 - 1.00 mg/dL   Calcium 8.8 (L) 8.9 - 10.3 mg/dL   Total Protein 7.2 6.5 - 8.1 g/dL   Albumin 2.8 (L) 3.5 - 5.0 g/dL   AST 40 15 - 41 U/L   ALT 29 0 - 44 U/L   Alkaline Phosphatase 183 (H) 38 - 126 U/L   Total Bilirubin 0.8 0.3 - 1.2 mg/dL   GFR, Estimated >02 >40 mL/min   Anion gap 17 (H) 5 - 15  hCG, quantitative, pregnancy   Collection Time: 12/30/21 11:50 PM  Result Value Ref Range   hCG, Beta Chain, Quant, S 4,035 (H) <5 mIU/mL  Wet prep, genital   Collection Time: 12/30/21 11:55 PM   Specimen: PATH Cytology Cervicovaginal Ancillary Only  Result Value Ref Range   Yeast Wet Prep HPF POC NONE SEEN NONE SEEN   Trich, Wet Prep NONE SEEN NONE SEEN   Clue Cells Wet Prep HPF POC NONE SEEN NONE SEEN   WBC, Wet Prep HPF POC <10 <10   Sperm NONE SEEN   Rapid HIV screen (HIV 1/2 Ab+Ag)   Collection Time: 12/31/21  1:14 AM  Result Value Ref Range   HIV-1 P24 Antigen - HIV24 NON REACTIVE NON REACTIVE   HIV 1/2 Antibodies NON REACTIVE NON REACTIVE   Interpretation (HIV Ag Ab)      A non reactive test result means that HIV 1 or HIV 2 antibodies and HIV 1 p24 antigen were not detected in the specimen.  Type and screen   Collection Time: 12/31/21  1:14 AM  Result Value Ref Range   ABO/RH(D) B POS    Antibody Screen NEG    Sample Expiration      01/03/2022,2359 Performed at [facility], [address], [place], [place] [address]     Imaging Studies: No results found.   Assessment and Plan: Patient Active Problem List   Diagnosis Date Noted   Unspecified hypertension, postpartum condition or complication 09/11/2013   GERD without  esophagitis 08/15/2013  CHTN worsened due to no meds vs CHTN with SIPE - Admit to Antenatal - Will start PO labetalol and Labetalol protocol - Recheck labs later this morning around 8am - NPO until that time.  - Uric acid pending to help differentiate SIPE vs CHTN - P/C ratio is 230 - We have no baseline labs to compare since no [facility] making the diagnosis more difficult   Routine PNC - Check PNL as well as TSH, A1C - EDC 01/28/22 putting her at [redacted]w[redacted]d by prelim report.  - Discussed MOD: She would prefer c-section over trial of labor. She knows she has that option but would prefer repeat c-section. She would also like BTS if that is an option as she is done with child bearing. We discussed this may be restricted due to insurance, but we can sign the papers later today. If she cannot have a BTS she would like a Nexplanon - If a boy, she would like a circ - She plans to bottle feed.  - Timing of delivery will be pending results and recheck of labs.     Milas Hock, MD, FACOG Attending Obstetrician & Gynecologist Faculty Practice, [facility]

## 2021-12-31 NOTE — MAU Note (Signed)
CRITICAL VALUE STICKER  CRITICAL VALUE: 2.3 Potassium  RECEIVER (on-site recipient of call):Bonnie Roig RNC  DATE & TIME NOTIFIED: 12/31/21@0055   MESSENGER (representative from lab):  MD NOTIFIED: Wynelle Bourgeois CNM  TIME OF NOTIFICATION: [phone]  RESPONSE:

## 2021-12-31 NOTE — MAU Provider Note (Addendum)
Chief Complaint:  Emesis During Pregnancy   Event Date/Time   First Provider Initiated Contact with Patient 12/31/21 0030     HPI: Lindaann Gradilla is a 31 y.o. [id number] at [facility] presents to maternity admissions reporting pelvic pressure and nausea/vomiting for 3 weeks.  States LMP March but have always had irregular cycles.  States only felt her belly was enlarged this week.  . She reports good fetal movement, denies LOF, vaginal bleeding, vaginal itching/burning, urinary symptoms, h/a, dizziness, diarrhea, constipation or fever/chills.  She denies headache, visual changes or RUQ abdominal pain.  Emesis  This is a new problem. The current episode started 1 to 4 weeks ago. The problem has been unchanged. There has been no fever. Pertinent negatives include no chills, diarrhea, dizziness, fever, headaches or myalgias. She has tried nothing for the symptoms.   RN Note: Annalea Alguire is a 31 y.o. at Unknown here in MAU reporting n/v for several wks but worse today. Does not know LMP but thinks maybe March. Irreg periods. Having a lot of pelvic pressure. Also eyes are very red LMP: unknow Onset of complaint: couple wks Pain score: 6  Past Medical History: Past Medical History:  Diagnosis Date   Asthma     Past obstetric history: OB History  Gravida Para Term Preterm AB Living  3 1 1   1 2   SAB IAB Ectopic Multiple Live Births        1 2    # Outcome Date GA Lbr Len/2nd Weight Sex Delivery Anes PTL Lv  3 Current           2A Term 09/01/13 [redacted]w[redacted]d  2441 g M CS-LTranv Spinal  LIV  2B Term 09/01/13 [redacted]w[redacted]d  2560 g M CS-LTranv Spinal  LIV  1 AB 09/26/12            Past Surgical History: Past Surgical History:  Procedure Laterality Date   CESAREAN SECTION N/A 09/01/2013   Procedure: CESAREAN SECTION;  Surgeon: 09/03/2013, MD;  Location: [facility];  Service: Obstetrics;  Laterality: N/A;   NO PAST SURGERIES      Family History: Family History  Problem Relation Age  of Onset   Hypertension Mother    Diabetes Mother    Hypertension Father    Diabetes Father     Social History: Social History   Tobacco Use   Smoking status: Former    Types: Cigars   Smokeless tobacco: Never  Substance Use Topics   Alcohol use: No   Drug use: No    Allergies: No Known Allergies  Meds:  Medications Prior to Admission  Medication Sig Dispense Refill Last Dose   amLODipine (NORVASC) 5 MG tablet Take 1 tablet (5 mg total) by mouth daily. 30 tablet 1    cyclobenzaprine (FLEXERIL) 10 MG tablet Take 1 tablet (10 mg total) by mouth 2 (two) times daily as needed for muscle spasms. 20 tablet 0    ibuprofen (ADVIL,MOTRIN) 800 MG tablet Take 1 tablet (800 mg total) by mouth 3 (three) times daily. 21 tablet 0    Prenatal Vit-Fe Fumarate-FA (PRENATAL MULTIVITAMIN) TABS tablet Take 1 tablet by mouth daily at 12 noon.       I have reviewed patient's Past Medical Hx, Surgical Hx, Family Hx, Social Hx, medications and allergies.   ROS:  Review of Systems  Constitutional:  Negative for chills and fever.  Gastrointestinal:  Positive for vomiting. Negative for diarrhea.  Musculoskeletal:  Negative for myalgias.  Neurological:  Negative  for dizziness and headaches.   Other systems negative  Physical Exam  Patient Vitals for the past 24 hrs:  BP Temp Pulse Resp SpO2 Height Weight  12/30/21 2301 (!) 158/98 -- -- -- -- -- --  12/30/21 2256 -- 98.4 F (36.9 C) 87 17 99 % 5\' 4"  (1.626 m) 99.8 kg   Vitals:   12/30/21 2301 12/31/21 0045 12/31/21 0101 12/31/21 0131  BP: (!) 158/98 (!) 167/98 (!) 153/103 (!) 172/104  Pulse:  (!) 102 (!) 101 94  Resp:      Temp:      SpO2:   100% 100%  Weight:      Height:        Constitutional: Well-developed, well-nourished female in no acute distress.  Cardiovascular: normal rate and rhythm Respiratory: normal effort, clear to auscultation bilaterally GI: Abd soft, non-tender, gravid appropriate for gestational age.   No rebound or  guarding. MS: Extremities nontender, Trace-1+ edema, normal ROM Neurologic: Alert and oriented x 4.  GU: Neg CVAT.  PELVIC EXAM:   Dilation: Closed Effacement (%): 60 Station: -3 Presentation: Vertex Exam by:: [email or web address], CNM   FHT:  Baseline 130 , moderate variability, accelerations present, no decelerations Contractions: Uterine irritability   Labs: Results for orders placed or performed during the hospital encounter of 12/30/21 (from the past 24 hour(s))  Urinalysis, Routine w reflex microscopic Urine, Clean Catch     Status: Abnormal   Collection Time: 12/30/21 10:37 PM  Result Value Ref Range   Color, Urine AMBER (A) YELLOW   APPearance HAZY (A) CLEAR   Specific Gravity, Urine 1.015 1.005 - 1.030   pH 7.0 5.0 - 8.0   Glucose, UA NEGATIVE NEGATIVE mg/dL   Hgb urine dipstick NEGATIVE NEGATIVE   Bilirubin Urine NEGATIVE NEGATIVE   Ketones, ur 80 (A) NEGATIVE mg/dL   Protein, ur 01/01/22 (A) NEGATIVE mg/dL   Nitrite NEGATIVE NEGATIVE   Leukocytes,Ua NEGATIVE NEGATIVE   RBC / HPF 0-5 0 - 5 RBC/hpf   WBC, UA 0-5 0 - 5 WBC/hpf   Bacteria, UA RARE (A) NONE SEEN   Squamous Epithelial / LPF 11-20 0 - 5   Mucus PRESENT   Pregnancy, urine POC     Status: Abnormal   Collection Time: 12/30/21 10:45 PM  Result Value Ref Range   Preg Test, Ur POSITIVE (A) NEGATIVE  CBC     Status: Abnormal   Collection Time: 12/30/21 11:50 PM  Result Value Ref Range   WBC 11.7 (H) 4.0 - 10.5 K/uL   RBC 4.38 3.87 - 5.11 MIL/uL   Hemoglobin 13.1 12.0 - 15.0 g/dL   HCT 01/01/22 45.4 - 09.8 %   MCV 86.5 80.0 - 100.0 fL   MCH 29.9 26.0 - 34.0 pg   MCHC 34.6 30.0 - 36.0 g/dL   RDW 11.9 (H) 14.7 - 82.9 %   Platelets 442 (H) 150 - 400 K/uL   nRBC 0.0 0.0 - 0.2 %  Comprehensive metabolic panel     Status: Abnormal   Collection Time: 12/30/21 11:50 PM  Result Value Ref Range   Sodium 140 135 - 145 mmol/L   Potassium 2.3 (LL) 3.5 - 5.1 mmol/L   Chloride 100 98 - 111 mmol/L   CO2 23 22 - 32 mmol/L    Glucose, Bld 98 70 - 99 mg/dL   BUN <5 (L) 6 - 20 mg/dL   Creatinine, Ser 01/01/22 0.44 - 1.00 mg/dL   Calcium 8.8 (L) 8.9 - 10.3 mg/dL  Total Protein 7.2 6.5 - 8.1 g/dL   Albumin 2.8 (L) 3.5 - 5.0 g/dL   AST 40 15 - 41 U/L   ALT 29 0 - 44 U/L   Alkaline Phosphatase 183 (H) 38 - 126 U/L   Total Bilirubin 0.8 0.3 - 1.2 mg/dL   GFR, Estimated >72 >09 mL/min   Anion gap 17 (H) 5 - 15  hCG, quantitative, pregnancy     Status: Abnormal   Collection Time: 12/30/21 11:50 PM  Result Value Ref Range   hCG, Beta Chain, Quant, S 4,035 (H) <5 mIU/mL  Wet prep, genital     Status: None   Collection Time: 12/30/21 11:55 PM   Specimen: PATH Cytology Cervicovaginal Ancillary Only  Result Value Ref Range   Yeast Wet Prep HPF POC NONE SEEN NONE SEEN   Trich, Wet Prep NONE SEEN NONE SEEN   Clue Cells Wet Prep HPF POC NONE SEEN NONE SEEN   WBC, Wet Prep HPF POC <10 <10   Sperm NONE SEEN      Imaging:  Placenta Anterior AFI 8.9cm  MAU Course/MDM: I have reviewed the triage vital signs and the nursing notes.   Pertinent labs & imaging results that were available during my care of the patient were reviewed by me and considered in my medical decision making (see chart for details).      I have reviewed her medical records including past results, notes and treatments.   I have ordered labs and reviewed results.  NST reviewed, reassuring Consult Dr Para March with presentation, exam findings and test results.  Treatments in MAU included IVF, antiemetics, [place], EFM.    Assessment: Single IUP at 37 weeks by Fundal Height Chronic Hypertension, rule out superimposed preeclampsia, severe range BPs Pelvic pressure Hypokalemia  Plan: Admit per Dr Para March K-Dur now Plan per Dr Para March MD to follow  Wynelle Bourgeois CNM, MSN Certified Nurse-Midwife 12/31/2021 12:31 AM

## 2022-01-01 DIAGNOSIS — Z3A36 36 weeks gestation of pregnancy: Secondary | ICD-10-CM

## 2022-01-01 DIAGNOSIS — O10919 Unspecified pre-existing hypertension complicating pregnancy, unspecified trimester: Secondary | ICD-10-CM

## 2022-01-01 LAB — COMPREHENSIVE METABOLIC PANEL
ALT: 29 U/L (ref 0–44)
ALT: 39 U/L (ref 0–44)
AST: 48 U/L — ABNORMAL HIGH (ref 15–41)
AST: 60 U/L — ABNORMAL HIGH (ref 15–41)
Albumin: 2 g/dL — ABNORMAL LOW (ref 3.5–5.0)
Albumin: 2.3 g/dL — ABNORMAL LOW (ref 3.5–5.0)
Alkaline Phosphatase: 137 U/L — ABNORMAL HIGH (ref 38–126)
Alkaline Phosphatase: 141 U/L — ABNORMAL HIGH (ref 38–126)
Anion gap: 14 (ref 5–15)
Anion gap: 12 (ref 5–15)
BUN: <5 mg/dL — ABNORMAL LOW (ref 6–20)
BUN: <5 mg/dL — ABNORMAL LOW (ref 6–20)
CO2: 21 mmol/L — ABNORMAL LOW (ref 22–32)
CO2: 23 mmol/L (ref 22–32)
Calcium: 8.1 mg/dL — ABNORMAL LOW (ref 8.9–10.3)
Calcium: 8.1 mg/dL — ABNORMAL LOW (ref 8.9–10.3)
Chloride: 104 mmol/L (ref 98–111)
Chloride: 104 mmol/L (ref 98–111)
Creatinine, Ser: 0.79 mg/dL (ref 0.44–1.00)
Creatinine, Ser: 0.79 mg/dL (ref 0.44–1.00)
GFR, Estimated: >60 mL/min (ref >60)
GFR, Estimated: >60 mL/min (ref >60)
Glucose, Bld: 136 mg/dL — ABNORMAL HIGH (ref 70–99)
Glucose, Bld: 92 mg/dL (ref 70–99)
Potassium: 2.2 mmol/L — CL (ref 3.5–5.1)
Potassium: 2.6 mmol/L — CL (ref 3.5–5.1)
Sodium: 139 mmol/L (ref 135–145)
Sodium: 139 mmol/L (ref 135–145)
Total Bilirubin: 0.7 mg/dL (ref 0.3–1.2)
Total Bilirubin: 0.5 mg/dL (ref 0.3–1.2)
Total Protein: 5.5 g/dL — ABNORMAL LOW (ref 6.5–8.1)
Total Protein: 6 g/dL — ABNORMAL LOW (ref 6.5–8.1)

## 2022-01-01 LAB — CBC
HCT: 30.4 % — ABNORMAL LOW (ref 36.0–46.0)
Hemoglobin: 10.3 g/dL — ABNORMAL LOW (ref 12.0–15.0)
MCH: 29.6 pg (ref 26.0–34.0)
MCHC: 33.9 g/dL (ref 30.0–36.0)
MCV: 87.4 fL (ref 80.0–100.0)
Platelets: 358 10*3/uL (ref 150–400)
RBC: 3.48 MIL/uL — ABNORMAL LOW (ref 3.87–5.11)
RDW: 17.7 % — ABNORMAL HIGH (ref 11.5–15.5)
WBC: 9.3 10*3/uL (ref 4.0–10.5)
nRBC: 0 % (ref 0.0–0.2)

## 2022-01-01 LAB — CREATININE CLEARANCE, URINE, 24 HOUR
Collection Interval-CRCL: 24 hours
Creatinine Clearance: 116 mL/min — ABNORMAL HIGH (ref 75–115)
Creatinine, 24H Ur: 1324 mg/d (ref 600–1800)
Creatinine, Urine: 189.1 mg/dL
Urine Total Volume-CRCL: 700 mL

## 2022-01-01 LAB — HEMOGLOBIN A1C
Hgb A1c MFr Bld: 5.3 % (ref 4.8–5.6)
Mean Plasma Glucose: 105 mg/dL

## 2022-01-01 LAB — MAGNESIUM: Magnesium: 1.7 mg/dL (ref 1.7–2.4)

## 2022-01-01 LAB — PROTEIN, URINE, 24 HOUR
Collection Interval-UPROT: 24 hours
Protein, 24H Urine: 245 mg/d — ABNORMAL HIGH (ref 50–100)
Protein, Urine: 35 mg/dL
Urine Total Volume-UPROT: 700 mL

## 2022-01-01 LAB — RUBELLA SCREEN: Rubella: 4.72 index (ref >0.99)

## 2022-01-01 MED ORDER — POTASSIUM CHLORIDE 10 MEQ/100ML IV SOLN
10.0000 meq | INTRAVENOUS | Status: AC
  Administered 2022-01-01 (×4): 10 meq via INTRAVENOUS
  Filled 2022-01-01 (×4): qty 100

## 2022-01-01 MED ORDER — NIFEDIPINE ER OSMOTIC RELEASE 30 MG PO TB24
30.0000 mg | ORAL_TABLET | Freq: Two times a day (BID) | ORAL | Status: DC
  Administered 2022-01-01 – 2022-01-02 (×2): 30 mg via ORAL
  Filled 2022-01-01 (×2): qty 1

## 2022-01-01 MED ORDER — MAGNESIUM SULFATE 2 GM/50ML IV SOLN
2.0000 g | Freq: Once | INTRAVENOUS | Status: AC
  Administered 2022-01-01: 2 g via INTRAVENOUS
  Filled 2022-01-01: qty 50

## 2022-01-01 MED ORDER — ARTIFICIAL TEARS OPHTHALMIC OINT
TOPICAL_OINTMENT | OPHTHALMIC | Status: DC | PRN
  Filled 2022-01-01: qty 3.5

## 2022-01-01 MED ORDER — TOBRAMYCIN 0.3 % OP SOLN
1.0000 [drp] | OPHTHALMIC | Status: DC
  Administered 2022-01-01 – 2022-01-02 (×7): 1 [drp] via OPHTHALMIC
  Filled 2022-01-01: qty 5

## 2022-01-01 MED ORDER — POTASSIUM CHLORIDE 10 MEQ/100ML IV SOLN
10.0000 meq | INTRAVENOUS | Status: AC
  Administered 2022-01-01 (×3): 10 meq via INTRAVENOUS
  Filled 2022-01-01 (×3): qty 100

## 2022-01-01 NOTE — Progress Notes (Signed)
Date and time results received: 01/01/22 1624 (use smartphrase ".now" to insert current time)  Test: Potassium Critical Value: 2.6  Name of Provider Notified: Dr. Shawnie Pons  Orders Received? Or Actions Taken?:  MD to repeat potassium level tomorrow

## 2022-01-01 NOTE — Progress Notes (Signed)
Patient ID: Monique [name], female   DOB: Mar 13, 1991, 31 y.o.   MRN: [id number] FACULTY PRACTICE ANTEPARTUM(COMPREHENSIVE) NOTE  Monique [name] is a 31 y.o. [id number] at [redacted]w[redacted]d by third trimester ultrasound who is admitted for elevated blood pressure.   Fetal presentation is cephalic. Length of Stay:  1  Days  ASSESSMENT: Principal Problem:   Chronic hypertension affecting pregnancy   Hypokalemia  PLAN: Chronic hypertension Patient with numerous blood pressures greater than 170/105 in the nonpregnant state Suspect this is more of the same. She has no proteinuria Mild elevation single LFTs today repeat labs at 3:00 BPs are somewhat improved on labetalol 400 3 times daily We will add Procardia daily Patient strongly desires discharge today will depend on what her 3 PM labs look like  Hypokalemia Profound hypokalemia despite multiple avenues of repletion Check magnesium today 40 mEq 3 times daily p.o. 4 runs of KCl for IV  Conjunctivitis Using warm compresses Add tobramycin In eyelid ointment  Subjective: Feels well today denies headache vision changes or right upper quadrant pain. Patient reports the fetal movement as active. Patient reports uterine contraction  activity as none. Patient reports  vaginal bleeding as none. Patient describes fluid per vagina as None.  Vitals:  Blood pressure (!) 148/83, pulse 80, temperature 98.6 F (37 C), temperature source Oral, resp. rate 18, height 5\' 4"  (1.626 m), weight 99.8 kg, SpO2 99 %. Physical Examination:  General appearance - alert, well appearing, and in no distress Chest - normal effort Abdomen - gravid, non-tender Fundal Height:  size equals dates Extremities: Homans sign is negative, no sign of DVT  Membranes:intact  Fetal Monitoring:  Baseline: 130 bpm, Variability: Good {> 6 bpm), Accelerations: Reactive, and Decelerations: Absent  Labs:  Results for orders placed or performed during the hospital encounter  of 12/30/21 (from the past 24 hour(s))  CBC   Collection Time: 01/01/22  4:35 AM  Result Value Ref Range   WBC 9.3 4.0 - 10.5 K/uL   RBC 3.48 (L) 3.87 - 5.11 MIL/uL   Hemoglobin 10.3 (L) 12.0 - 15.0 g/dL   HCT 01/03/22 (L) 23.7 - 62.8 %   MCV 87.4 80.0 - 100.0 fL   MCH 29.6 26.0 - 34.0 pg   MCHC 33.9 30.0 - 36.0 g/dL   RDW 31.5 (H) 17.6 - 16.0 %   Platelets 358 150 - 400 K/uL   nRBC 0.0 0.0 - 0.2 %  Comprehensive metabolic panel   Collection Time: 01/01/22  4:35 AM  Result Value Ref Range   Sodium 139 135 - 145 mmol/L   Potassium 2.2 (LL) 3.5 - 5.1 mmol/L   Chloride 104 98 - 111 mmol/L   CO2 21 (L) 22 - 32 mmol/L   Glucose, Bld 136 (H) 70 - 99 mg/dL   BUN <5 (L) 6 - 20 mg/dL   Creatinine, Ser 01/03/22 0.44 - 1.00 mg/dL   Calcium 8.1 (L) 8.9 - 10.3 mg/dL   Total Protein 5.5 (L) 6.5 - 8.1 g/dL   Albumin 2.0 (L) 3.5 - 5.0 g/dL   AST 48 (H) 15 - 41 U/L   ALT 29 0 - 44 U/L   Alkaline Phosphatase 137 (H) 38 - 126 U/L   Total Bilirubin 0.7 0.3 - 1.2 mg/dL   GFR, Estimated 1.06 >26 mL/min   Anion gap 14 5 - 15    Imaging Studies:       Medications:  Scheduled  docusate sodium  100 mg Oral Daily   labetalol  400  mg Oral TID   potassium chloride  40 mEq Oral TID   prenatal multivitamin  1 tablet Oral Q1200   scopolamine  1 patch Transdermal Once   sodium chloride flush  3 mL Intravenous Q12H   sodium chloride flush  3 mL Intravenous Q12H   tobramycin  1 drop Both Eyes Q4H   I have reviewed the patient's current medications.   Reva Bores, MD 01/01/2022,11:31 AM

## 2022-01-02 ENCOUNTER — Other Ambulatory Visit (HOSPITAL_COMMUNITY): Payer: Self-pay

## 2022-01-02 DIAGNOSIS — E876 Hypokalemia: Secondary | ICD-10-CM | POA: Diagnosis present

## 2022-01-02 DIAGNOSIS — O0930 Supervision of pregnancy with insufficient antenatal care, unspecified trimester: Secondary | ICD-10-CM

## 2022-01-02 DIAGNOSIS — R7989 Other specified abnormal findings of blood chemistry: Secondary | ICD-10-CM | POA: Diagnosis not present

## 2022-01-02 DIAGNOSIS — H109 Unspecified conjunctivitis: Secondary | ICD-10-CM

## 2022-01-02 DIAGNOSIS — O34219 Maternal care for unspecified type scar from previous cesarean delivery: Secondary | ICD-10-CM | POA: Diagnosis present

## 2022-01-02 LAB — COMPREHENSIVE METABOLIC PANEL
ALT: 42 U/L (ref 0–44)
ALT: 50 U/L — ABNORMAL HIGH (ref 0–44)
AST: 59 U/L — ABNORMAL HIGH (ref 15–41)
AST: 67 U/L — ABNORMAL HIGH (ref 15–41)
Albumin: 1.9 g/dL — ABNORMAL LOW (ref 3.5–5.0)
Albumin: 2.1 g/dL — ABNORMAL LOW (ref 3.5–5.0)
Alkaline Phosphatase: 121 U/L (ref 38–126)
Alkaline Phosphatase: 129 U/L — ABNORMAL HIGH (ref 38–126)
Anion gap: 11 (ref 5–15)
Anion gap: 10 (ref 5–15)
BUN: <5 mg/dL — ABNORMAL LOW (ref 6–20)
BUN: <5 mg/dL — ABNORMAL LOW (ref 6–20)
CO2: 20 mmol/L — ABNORMAL LOW (ref 22–32)
CO2: 21 mmol/L — ABNORMAL LOW (ref 22–32)
Calcium: 7.9 mg/dL — ABNORMAL LOW (ref 8.9–10.3)
Calcium: 7.8 mg/dL — ABNORMAL LOW (ref 8.9–10.3)
Chloride: 111 mmol/L (ref 98–111)
Chloride: 109 mmol/L (ref 98–111)
Creatinine, Ser: 0.79 mg/dL (ref 0.44–1.00)
Creatinine, Ser: 0.8 mg/dL (ref 0.44–1.00)
GFR, Estimated: >60 mL/min (ref >60)
GFR, Estimated: >60 mL/min (ref >60)
Glucose, Bld: 94 mg/dL (ref 70–99)
Glucose, Bld: 128 mg/dL — ABNORMAL HIGH (ref 70–99)
Potassium: 2.7 mmol/L — CL (ref 3.5–5.1)
Potassium: 3.1 mmol/L — ABNORMAL LOW (ref 3.5–5.1)
Sodium: 142 mmol/L (ref 135–145)
Sodium: 140 mmol/L (ref 135–145)
Total Bilirubin: 0.5 mg/dL (ref 0.3–1.2)
Total Bilirubin: 0.6 mg/dL (ref 0.3–1.2)
Total Protein: 5.2 g/dL — ABNORMAL LOW (ref 6.5–8.1)
Total Protein: 5.6 g/dL — ABNORMAL LOW (ref 6.5–8.1)

## 2022-01-02 LAB — CBC
HCT: 28.9 % — ABNORMAL LOW (ref 36.0–46.0)
Hemoglobin: 9.9 g/dL — ABNORMAL LOW (ref 12.0–15.0)
MCH: 30.1 pg (ref 26.0–34.0)
MCHC: 34.3 g/dL (ref 30.0–36.0)
MCV: 87.8 fL (ref 80.0–100.0)
Platelets: 353 10*3/uL (ref 150–400)
RBC: 3.29 MIL/uL — ABNORMAL LOW (ref 3.87–5.11)
RDW: 18 % — ABNORMAL HIGH (ref 11.5–15.5)
WBC: 8.8 10*3/uL (ref 4.0–10.5)
nRBC: 0 % (ref 0.0–0.2)

## 2022-01-02 MED ORDER — NIFEDIPINE ER 30 MG PO TB24
30.0000 mg | ORAL_TABLET | Freq: Two times a day (BID) | ORAL | 3 refills | Status: DC
  Filled 2022-01-02: qty 60, 30d supply, fill #0

## 2022-01-02 MED ORDER — SCOPOLAMINE 1 MG/3DAYS TD PT72
1.0000 | MEDICATED_PATCH | Freq: Once | TRANSDERMAL | 1 refills | Status: AC
  Filled 2022-01-02: qty 8, 8d supply, fill #0

## 2022-01-02 MED ORDER — POTASSIUM CHLORIDE 10 MEQ/100ML IV SOLN
10.0000 meq | INTRAVENOUS | Status: AC
  Administered 2022-01-02 (×3): 10 meq via INTRAVENOUS
  Filled 2022-01-02 (×3): qty 100

## 2022-01-02 MED ORDER — POTASSIUM CHLORIDE 10 MEQ/100ML IV SOLN: 10.0000 meq | INTRAVENOUS | Status: DC

## 2022-01-02 MED ORDER — LABETALOL HCL 200 MG PO TABS
400.0000 mg | ORAL_TABLET | Freq: Three times a day (TID) | ORAL | 0 refills | Status: DC
  Filled 2022-01-02: qty 30, 5d supply, fill #0

## 2022-01-02 MED ORDER — POTASSIUM CHLORIDE CRYS ER 20 MEQ PO TBCR
40.0000 meq | EXTENDED_RELEASE_TABLET | Freq: Three times a day (TID) | ORAL | 0 refills | Status: DC
  Filled 2022-01-02: qty 18, 3d supply, fill #0

## 2022-01-02 NOTE — Pre-Procedure Instructions (Signed)
Pt is inpatient in [facility].  Probable discharge prior to CS.  Went to see pt and gave her her preop instructions sheet with instructions to return on Monday July 3 for preop visit.  Verbalized understanding and all questions answered

## 2022-01-02 NOTE — Progress Notes (Signed)
Pt discharged  and will come back for labs tomorrow  and surgery wednesday

## 2022-01-02 NOTE — Discharge Instructions (Signed)
Return in the morning early to Maternity Assessment Unit having had nothing to eat or drink after midnight for repeat labs. If worsening liver tests, we would deliver you tomorrow with C-section and tubal.

## 2022-01-02 NOTE — Patient Instructions (Addendum)
Monique [name]  01/02/2022   Your procedure is scheduled on:  01/17/2022  Arrive at 0705 at Entrance C on [address] at [facility]  and [employer]. You are invited to use the FREE valet parking or use the Visitor's parking deck.  Pick up the phone at the desk and dial [phone].  Call this number if you have problems the morning of surgery: [phone]  Remember:   Do not eat food:(After Midnight) Desps de medianoche.  Do not drink clear liquids: (After Midnight) Desps de medianoche.  Take these medicines the morning of surgery with A SIP OF WATER:  Use tobramycin eyedrops as prescribed,  Take labetalol, Nifedipine and Potassium as prescribed.   Do not wear jewelry, make-up or nail polish.  Do not wear lotions, powders, or perfumes. Do not wear deodorant.  Do not shave 48 hours prior to surgery.  Do not bring valuables to the hospital.  [facility] is not   responsible for any belongings or valuables brought to the hospital.  Contacts, dentures or bridgework may not be worn into surgery.  Leave suitcase in the car. After surgery it may be brought to your room.  For patients admitted to the hospital, checkout time is 11:00 AM the day of              discharge.      Please read over the following fact sheets that you were given:     Preparing for Surgery  [facility] on July 3 at 1030 for preop visit.  Tell the front desk you are here for your preop visit.

## 2022-01-02 NOTE — Discharge Summary (Signed)
Antenatal Physician Discharge Summary  Patient ID: Monique [name] MRN: [id number] DOB/AGE: 01/04/1991 31 y.o.  Admit date: 12/30/2021 Discharge date: 01/02/2022  Admission Diagnoses: Principal Problem:   Chronic hypertension affecting pregnancy Active Problems:   Hypokalemia   Abnormal liver function tests   Previous cesarean delivery affecting pregnancy, antepartum   Limited prenatal care   Conjunctivitis  Chronic hypertension Patient with numerous blood pressures greater than 170/105 in the nonpregnant state She has no proteinuria Serum creatinine is stable at 0.8 Platelets are normal in the 350 range See below for discussion of her LFTs.  Abnormal LFTs Initially were within normal limits and then began creeping up as the days went on If still or not 2 times the upper limit of normal Patient strongly needed to go home to arrange for childcare for her kids. Plan is for her to return in the morning for repeat CMP and be n.p.o. in case we need to affect delivery.  Previous cesarean section For repeat C-section and tubal ligation   Hypokalemia Profound hypokalemia despite multiple avenues of repletion Check magnesium today - repleted 40 mEq 3 times daily p.o. IV potassium for several x 14.   Conjunctivitis Using warm compresses Add tobramycin In eyelid ointment   Discharge Diagnoses: Same  Prenatal Procedures: NST and ultrasound  Consults: Neonatology, Maternal Fetal Medicine  Hospital Course:  Monique [name] is a 31 y.o. [id number] with IUP at [redacted]w[redacted]d admitted for elevated blood pressure.  Patient arrived with significant nausea and vomiting and a possible pregnancy.  At that time she was found to be 36 weeks with severe range blood pressures.  Per chart review patient had had similarly elevated blood pressures in this exact same range in a nonpregnant state in 2021.  Patient history of prior C-section and desires repeat with bilateral tubal ligation.  Patient  was started on labetalol initially 200 twice daily this was increased to 400 3 times daily and when her pressures are still in the mild range we did add Procardia 30 mg XL twice daily which brought her blood pressures into normal range.  Initially were thinking about if this were chronic hypertension potentially letting her go soon as we correct her hypokalemia.  It took many days and many IV and p.o. as well as magnesium repletion before her potassium finally got above 3.  During this time we were checking her LFTs which began to creep up.  Reviewed with pharmacy there was no good reason why her LFTs should continue to creep up.  The patient strongly needed to get home so she can get her kids arrangements taking care of prior to delivery.  With shared decision making we elected to let her go home despite her rising LFTs with the promised to return in the morning for repeat labs and a plan for delivery at that time if need be.  She was deemed stable for discharge to home with outpatient follow up.  Discharge Exam: Temp:  [97.9 F (36.6 C)-99 F (37.2 C)] 98 F (36.7 C) (06/30 1222) Pulse Rate:  [71-100] 83 (06/30 1222) Resp:  [16-19] 17 (06/30 1222) BP: (118-156)/(70-93) 138/85 (06/30 1222) SpO2:  [96 %-100 %] 100 % (06/30 1222) Physical Examination: CONSTITUTIONAL: Well-developed, well-nourished female in no acute distress.  HENT:  Normocephalic, atraumatic, External right and left ear normal.  EYES: Conjunctivae and EOM are normal. No scleral icterus.  NECK: Normal range of motion, supple, no masses SKIN: Skin is warm and dry. No rash noted. Not diaphoretic. No erythema.  No pallor. NEUROLOGIC: Alert and oriented to person, place, and time. Normal reflexes, muscle tone coordination. No cranial nerve deficit noted. PSYCHIATRIC: Normal mood and affect. Normal behavior. Normal judgment and thought content. CARDIOVASCULAR: Normal heart rate noted, regular rhythm RESPIRATORY: Effort and breath sounds  normal, no problems with respiration noted MUSCULOSKELETAL: Normal range of motion. No edema and no tenderness. 2+ distal pulses. ABDOMEN: Soft, nontender, nondistended, gravid. CERVIX: Dilation: Closed Effacement (%): 60 Station: -3 Presentation: Vertex Exam by:: Wynelle Bourgeois, CNM  Fetal monitoring: FHR: 150 bpm, Variability: moderate, Accelerations: Present, Decelerations: Absent  Uterine activity: no contractions per hour  Significant Diagnostic Studies:  Results for orders placed or performed during the hospital encounter of 12/30/21 (from the past 168 hour(s))  Urinalysis, Routine w reflex microscopic Urine, Clean Catch   Collection Time: 12/30/21 10:37 PM  Result Value Ref Range   Color, Urine AMBER (A) YELLOW   APPearance HAZY (A) CLEAR   Specific Gravity, Urine 1.015 1.005 - 1.030   pH 7.0 5.0 - 8.0   Glucose, UA NEGATIVE NEGATIVE mg/dL   Hgb urine dipstick NEGATIVE NEGATIVE   Bilirubin Urine NEGATIVE NEGATIVE   Ketones, ur 80 (A) NEGATIVE mg/dL   Protein, ur 161 (A) NEGATIVE mg/dL   Nitrite NEGATIVE NEGATIVE   Leukocytes,Ua NEGATIVE NEGATIVE   RBC / HPF 0-5 0 - 5 RBC/hpf   WBC, UA 0-5 0 - 5 WBC/hpf   Bacteria, UA RARE (A) NONE SEEN   Squamous Epithelial / LPF 11-20 0 - 5   Mucus PRESENT   Protein / creatinine ratio, urine   Collection Time: 12/30/21 10:37 PM  Result Value Ref Range   Creatinine, Urine 345.95 mg/dL   Total Protein, Urine 79 mg/dL   Protein Creatinine Ratio 0.23 (H) 0.00 - 0.15 mg/mg[Cre]  Pregnancy, urine POC   Collection Time: 12/30/21 10:45 PM  Result Value Ref Range   Preg Test, Ur POSITIVE (A) NEGATIVE  GC/Chlamydia probe amp ([place])not at [facility]   Collection Time: 12/30/21 11:38 PM  Result Value Ref Range   Neisseria Gonorrhea Negative    Chlamydia Negative    Comment Normal Reference Ranger Chlamydia - Negative    Comment      Normal Reference Range Neisseria Gonorrhea - Negative  CBC   Collection Time: 12/30/21 11:50 PM   Result Value Ref Range   WBC 11.7 (H) 4.0 - 10.5 K/uL   RBC 4.38 3.87 - 5.11 MIL/uL   Hemoglobin 13.1 12.0 - 15.0 g/dL   HCT 09.6 04.5 - 40.9 %   MCV 86.5 80.0 - 100.0 fL   MCH 29.9 26.0 - 34.0 pg   MCHC 34.6 30.0 - 36.0 g/dL   RDW 81.1 (H) 91.4 - 78.2 %   Platelets 442 (H) 150 - 400 K/uL   nRBC 0.0 0.0 - 0.2 %  Comprehensive metabolic panel   Collection Time: 12/30/21 11:50 PM  Result Value Ref Range   Sodium 140 135 - 145 mmol/L   Potassium 2.3 (LL) 3.5 - 5.1 mmol/L   Chloride 100 98 - 111 mmol/L   CO2 23 22 - 32 mmol/L   Glucose, Bld 98 70 - 99 mg/dL   BUN <5 (L) 6 - 20 mg/dL   Creatinine, Ser 9.56 0.44 - 1.00 mg/dL   Calcium 8.8 (L) 8.9 - 10.3 mg/dL   Total Protein 7.2 6.5 - 8.1 g/dL   Albumin 2.8 (L) 3.5 - 5.0 g/dL   AST 40 15 - 41 U/L   ALT 29 0 -  44 U/L   Alkaline Phosphatase 183 (H) 38 - 126 U/L   Total Bilirubin 0.8 0.3 - 1.2 mg/dL   GFR, Estimated >16 >10 mL/min   Anion gap 17 (H) 5 - 15  hCG, quantitative, pregnancy   Collection Time: 12/30/21 11:50 PM  Result Value Ref Range   hCG, Beta Chain, Quant, S 4,035 (H) <5 mIU/mL  Wet prep, genital   Collection Time: 12/30/21 11:55 PM   Specimen: PATH Cytology Cervicovaginal Ancillary Only  Result Value Ref Range   Yeast Wet Prep HPF POC NONE SEEN NONE SEEN   Trich, Wet Prep NONE SEEN NONE SEEN   Clue Cells Wet Prep HPF POC NONE SEEN NONE SEEN   WBC, Wet Prep HPF POC <10 <10   Sperm NONE SEEN   Group B strep by PCR   Collection Time: 12/31/21  1:00 AM   Specimen: Vaginal/Rectal; Genital  Result Value Ref Range   Group B strep by PCR POSITIVE (A) NEGATIVE  Hepatitis B surface antigen   Collection Time: 12/31/21  1:14 AM  Result Value Ref Range   Hepatitis B Surface Ag NON REACTIVE NON REACTIVE  Rapid HIV screen (HIV 1/2 Ab+Ag)   Collection Time: 12/31/21  1:14 AM  Result Value Ref Range   HIV-1 P24 Antigen - HIV24 NON REACTIVE NON REACTIVE   HIV 1/2 Antibodies NON REACTIVE NON REACTIVE   Interpretation  (HIV Ag Ab)      A non reactive test result means that HIV 1 or HIV 2 antibodies and HIV 1 p24 antigen were not detected in the specimen.  TSH   Collection Time: 12/31/21  1:14 AM  Result Value Ref Range   TSH 1.955 0.350 - 4.500 uIU/mL  Type and screen   Collection Time: 12/31/21  1:14 AM  Result Value Ref Range   ABO/RH(D) B POS    Antibody Screen NEG    Sample Expiration      01/03/2022,2359 Performed at [facility], [address] [place], [place] [address]   Rubella screen   Collection Time: 12/31/21  2:51 AM  Result Value Ref Range   Rubella 4.72 Immune >0.99 index  RPR   Collection Time: 12/31/21  2:51 AM  Result Value Ref Range   RPR Ser Ql NON REACTIVE NON REACTIVE  Uric acid   Collection Time: 12/31/21  2:51 AM  Result Value Ref Range   Uric Acid, Serum 7.7 (H) 2.5 - 7.1 mg/dL  Hemoglobin W0J   Collection Time: 12/31/21  2:51 AM  Result Value Ref Range   Hgb A1c MFr Bld 5.3 4.8 - 5.6 %   Mean Plasma Glucose 105 mg/dL  Protein, urine, 24 hour   Collection Time: 12/31/21  4:38 AM  Result Value Ref Range   Urine Total Volume-UPROT 700 mL   Collection Interval-UPROT 24 hours   Protein, Urine 35 mg/dL   Protein, 81X Urine 914 (H) 50 - 100 mg/day  Creatinine clearance, urine, 24 hour   Collection Time: 12/31/21  4:38 AM  Result Value Ref Range   Urine Total Volume-CRCL 700 mL   Collection Interval-CRCL 24 hours   Creatinine, Urine 189.10 mg/dL   Creatinine, 78G Ur 9,562 600 - 1,800 mg/day   Creatinine Clearance 116 (H) 75 - 115 mL/min  CBC   Collection Time: 12/31/21  6:51 AM  Result Value Ref Range   WBC 10.0 4.0 - 10.5 K/uL   RBC 3.71 (L) 3.87 - 5.11 MIL/uL   Hemoglobin 11.1 (L)  12.0 - 15.0 g/dL   HCT 16.1 (L) 09.6 - 04.5 %   MCV 86.5 80.0 - 100.0 fL   MCH 29.9 26.0 - 34.0 pg   MCHC 34.6 30.0 - 36.0 g/dL   RDW 40.9 (H) 81.1 - 91.4 %   Platelets 369 150 - 400 K/uL   nRBC 0.0 0.0 - 0.2 %  Comprehensive metabolic panel   Collection Time:  12/31/21  6:51 AM  Result Value Ref Range   Sodium 141 135 - 145 mmol/L   Potassium 2.1 (LL) 3.5 - 5.1 mmol/L   Chloride 103 98 - 111 mmol/L   CO2 22 22 - 32 mmol/L   Glucose, Bld 91 70 - 99 mg/dL   BUN <5 (L) 6 - 20 mg/dL   Creatinine, Ser 7.82 0.44 - 1.00 mg/dL   Calcium 8.2 (L) 8.9 - 10.3 mg/dL   Total Protein 5.8 (L) 6.5 - 8.1 g/dL   Albumin 2.2 (L) 3.5 - 5.0 g/dL   AST 38 15 - 41 U/L   ALT 25 0 - 44 U/L   Alkaline Phosphatase 145 (H) 38 - 126 U/L   Total Bilirubin 0.9 0.3 - 1.2 mg/dL   GFR, Estimated >95 >62 mL/min   Anion gap 16 (H) 5 - 15  CBC   Collection Time: 01/01/22  4:35 AM  Result Value Ref Range   WBC 9.3 4.0 - 10.5 K/uL   RBC 3.48 (L) 3.87 - 5.11 MIL/uL   Hemoglobin 10.3 (L) 12.0 - 15.0 g/dL   HCT 13.0 (L) 86.5 - 78.4 %   MCV 87.4 80.0 - 100.0 fL   MCH 29.6 26.0 - 34.0 pg   MCHC 33.9 30.0 - 36.0 g/dL   RDW 69.6 (H) 29.5 - 28.4 %   Platelets 358 150 - 400 K/uL   nRBC 0.0 0.0 - 0.2 %  Comprehensive metabolic panel   Collection Time: 01/01/22  4:35 AM  Result Value Ref Range   Sodium 139 135 - 145 mmol/L   Potassium 2.2 (LL) 3.5 - 5.1 mmol/L   Chloride 104 98 - 111 mmol/L   CO2 21 (L) 22 - 32 mmol/L   Glucose, Bld 136 (H) 70 - 99 mg/dL   BUN <5 (L) 6 - 20 mg/dL   Creatinine, Ser 1.32 0.44 - 1.00 mg/dL   Calcium 8.1 (L) 8.9 - 10.3 mg/dL   Total Protein 5.5 (L) 6.5 - 8.1 g/dL   Albumin 2.0 (L) 3.5 - 5.0 g/dL   AST 48 (H) 15 - 41 U/L   ALT 29 0 - 44 U/L   Alkaline Phosphatase 137 (H) 38 - 126 U/L   Total Bilirubin 0.7 0.3 - 1.2 mg/dL   GFR, Estimated >44 >01 mL/min   Anion gap 14 5 - 15  Comprehensive metabolic panel   Collection Time: 01/01/22  2:49 PM  Result Value Ref Range   Sodium 139 135 - 145 mmol/L   Potassium 2.6 (LL) 3.5 - 5.1 mmol/L   Chloride 104 98 - 111 mmol/L   CO2 23 22 - 32 mmol/L   Glucose, Bld 92 70 - 99 mg/dL   BUN <5 (L) 6 - 20 mg/dL   Creatinine, Ser 0.27 0.44 - 1.00 mg/dL   Calcium 8.1 (L) 8.9 - 10.3 mg/dL   Total Protein  6.0 (L) 6.5 - 8.1 g/dL   Albumin 2.3 (L) 3.5 - 5.0 g/dL   AST 60 (H) 15 - 41 U/L   ALT 39 0 - 44 U/L  Alkaline Phosphatase 141 (H) 38 - 126 U/L   Total Bilirubin 0.5 0.3 - 1.2 mg/dL   GFR, Estimated >16 >10 mL/min   Anion gap 12 5 - 15  Magnesium   Collection Time: 01/01/22  2:49 PM  Result Value Ref Range   Magnesium 1.7 1.7 - 2.4 mg/dL  Comprehensive metabolic panel   Collection Time: 01/02/22  4:12 AM  Result Value Ref Range   Sodium 142 135 - 145 mmol/L   Potassium 2.7 (LL) 3.5 - 5.1 mmol/L   Chloride 111 98 - 111 mmol/L   CO2 20 (L) 22 - 32 mmol/L   Glucose, Bld 94 70 - 99 mg/dL   BUN <5 (L) 6 - 20 mg/dL   Creatinine, Ser 9.60 0.44 - 1.00 mg/dL   Calcium 7.9 (L) 8.9 - 10.3 mg/dL   Total Protein 5.2 (L) 6.5 - 8.1 g/dL   Albumin 1.9 (L) 3.5 - 5.0 g/dL   AST 59 (H) 15 - 41 U/L   ALT 42 0 - 44 U/L   Alkaline Phosphatase 121 38 - 126 U/L   Total Bilirubin 0.5 0.3 - 1.2 mg/dL   GFR, Estimated >45 >40 mL/min   Anion gap 11 5 - 15  CBC   Collection Time: 01/02/22  4:12 AM  Result Value Ref Range   WBC 8.8 4.0 - 10.5 K/uL   RBC 3.29 (L) 3.87 - 5.11 MIL/uL   Hemoglobin 9.9 (L) 12.0 - 15.0 g/dL   HCT 98.1 (L) 19.1 - 47.8 %   MCV 87.8 80.0 - 100.0 fL   MCH 30.1 26.0 - 34.0 pg   MCHC 34.3 30.0 - 36.0 g/dL   RDW 29.5 (H) 62.1 - 30.8 %   Platelets 353 150 - 400 K/uL   nRBC 0.0 0.0 - 0.2 %  Comprehensive metabolic panel   Collection Time: 01/02/22 12:46 PM  Result Value Ref Range   Sodium 140 135 - 145 mmol/L   Potassium 3.1 (L) 3.5 - 5.1 mmol/L   Chloride 109 98 - 111 mmol/L   CO2 21 (L) 22 - 32 mmol/L   Glucose, Bld 128 (H) 70 - 99 mg/dL   BUN <5 (L) 6 - 20 mg/dL   Creatinine, Ser 6.57 0.44 - 1.00 mg/dL   Calcium 7.8 (L) 8.9 - 10.3 mg/dL   Total Protein 5.6 (L) 6.5 - 8.1 g/dL   Albumin 2.1 (L) 3.5 - 5.0 g/dL   AST 67 (H) 15 - 41 U/L   ALT 50 (H) 0 - 44 U/L   Alkaline Phosphatase 129 (H) 38 - 126 U/L   Total Bilirubin 0.6 0.3 - 1.2 mg/dL   GFR, Estimated >84 >69  mL/min   Anion gap 10 5 - 15   [place] MFM OB DETAIL +14 WK  Result Date: 12/31/2021 ----------------------------------------------------------------------  OBSTETRICS REPORT                       (Signed Final 12/31/2021 11:39 am) ---------------------------------------------------------------------- Patient Info  ID #:       [id number]                          D.O.B.:  Nov 27, 1990 (30 yrs)  Name:       Army Fossa-               Visit Date: 12/31/2021 12:30 am              BOND ---------------------------------------------------------------------- Performed  By  Attending:        Lin Landsman      Secondary Phy.:   [facility] MAU/Triage                    MD  Performed By:     Marcellina Millin          [facility]  Referred By:      Aviva Signs ---------------------------------------------------------------------- Orders  #  Description                           Code        Ordered By  1  [place] MFM OB DETAIL +14 WK               76811.01    Wynelle Bourgeois  2  [place] MFM FETAL BPP WO NON               76819.01    [facility]     STRESS ----------------------------------------------------------------------  #  Order #                     Accession #                Episode #  1  [id number]                   [id number]                 [id number]  2  [id number]                   [id number]                 [id number] ---------------------------------------------------------------------- Indications  Pelvic pain affecting pregnancy in third       O26.893  trimester  [redacted] weeks gestation of pregnancy                Z3A.36  Encounter for uncertain dates                  Z36.87  Encounter for antenatal screening for          Z36.3  malformations  Hypertension - Chronic with superimposed       O11.9 O10.919  preeclampsia (severe range BPs in MAU)  Previous cesarean delivery, antepartum         O34.219  ---------------------------------------------------------------------- Fetal Evaluation  Num Of Fetuses:         1  Fetal Heart Rate(bpm):  132  Cardiac Activity:       Observed  Presentation:           Cephalic  Placenta:               Anterior  P. Cord Insertion:      Not well visualized  Amniotic Fluid  AFI FV:      Within normal limits  AFI Sum(cm)     %Tile       Largest Pocket(cm)  8.91  14          3.07  RUQ(cm)       RLQ(cm)       LUQ(cm)        LLQ(cm)  1.75          3.07          1.69           2.4  Comment:    No placental abruption or previa identified. ---------------------------------------------------------------------- Biophysical Evaluation  Amniotic F.V:   Within normal limits       F. Tone:        Observed  F. Movement:    Observed                   Score:          8/8  F. Breathing:   Observed ---------------------------------------------------------------------- Biometry  BPD:      85.9  mm     G. Age:  34w 4d         21  %    CI:        73.75   %    70 - 86                                                          FL/HC:      22.0   %    20.1 - 22.1  HC:    317.72   mm     G. Age:  35w 5d         15  %    HC/AC:      0.94        0.93 - 1.11  AC:    337.87   mm     G. Age:  37w 5d         94  %    FL/BPD:     81.4   %    71 - 87  FL:       69.9  mm     G. Age:  35w 6d         41  %    FL/AC:      20.7   %    20 - 24  HUM:      59.8  mm     G. Age:  34w 5d         39  %  Est. FW:    3000  gm    6 lb 10 oz      70  % ---------------------------------------------------------------------- OB History  Gravidity:    1         Term:   1        Prem:   0        SAB:   0  TOP:          0       Ectopic:  0        Living: 2 ---------------------------------------------------------------------- Gestational Age  U/S Today:     36w 0d                                        EDD:  01/28/22  Best:          36w 0d     Det. By:  U/S (12/31/21)           EDD:   01/28/22  ---------------------------------------------------------------------- Anatomy  Cranium:               Appears normal         LVOT:                   Not well visualized  Cavum:                 Appears normal         Aortic Arch:            Not well visualized  Ventricles:            Appears normal         Ductal Arch:            Not well visualized  Choroid Plexus:        Not well visualized    Diaphragm:              Appears normal  Cerebellum:            Not well visualized    Stomach:                Appears normal, left                                                                        sided  Posterior Fossa:       Not well visualized    Abdomen:                Appears normal  Nuchal Fold:           Not applicable (>20    Abdominal Wall:         Not well visualized                         wks GA)  Face:                  Not well visualized    Cord Vessels:           Appears normal (3                                                                        vessel cord)  Lips:                  Not well visualized    Kidneys:                Appear normal  Palate:                Not well visualized    Bladder:                Appears  normal  Thoracic:              Appears normal         Spine:                  Not well visualized  Heart:                 Not well visualized    Upper Extremities:      Visualized  RVOT:                  Not well visualized    Lower Extremities:      Visualized  Other:  Technically difficult due to advanced GA and fetal position. ---------------------------------------------------------------------- Cervix Uterus Adnexa  Cervix  Not visualized (advanced GA >24wks)  Uterus  No abnormality visualized.  Right Ovary  Not visualized. No adnexal mass visualized.  Left Ovary  Within normal limits.  Adnexa  No abnormality visualized. ---------------------------------------------------------------------- Impression  Single intrauterine pregnancy here for a detailed anatomy  due pelvic pain and  suspected preeclampsia.  Normal anatomy with measurements consistent with 36  weeks based on today's ultrasound.  There is good fetal movement and amniotic fluid volume  Suboptimal views of the fetal anatomy were obtained  secondary to fetal position. ---------------------------------------------------------------------- Recommendations  Management per inpatient provider. ----------------------------------------------------------------------              Lin Landsman, MD Electronically Signed Final Report   12/31/2021 11:39 am ----------------------------------------------------------------------  [place] MFM FETAL BPP WO NON STRESS  Result Date: 12/31/2021 ----------------------------------------------------------------------  OBSTETRICS REPORT                       (Signed Final 12/31/2021 11:39 am) ---------------------------------------------------------------------- Patient Info  ID #:       [id number]                          D.O.B.:  07-Dec-1990 (30 yrs)  Name:       Army Fossa-               Visit Date: 12/31/2021 12:30 am              BOND ---------------------------------------------------------------------- Performed By  Attending:        Lin Landsman      Secondary Phy.:   [facility] MAU/Triage                    MD  Performed By:     Marcellina Millin          [facility]  Referred By:      Aviva Signs ---------------------------------------------------------------------- Orders  #  Description                           Code        Ordered By  1  [place] MFM OB DETAIL +14 WK               79024.09    Wynelle Bourgeois  2  [place] MFM FETAL BPP WO NON               76819.01    [facility]     STRESS ----------------------------------------------------------------------  #  Order #                     Accession #                Episode #  1  [id number]                   [id number]                 [id number]  2   [id number]                   [id number]                 [id number] ---------------------------------------------------------------------- Indications  Pelvic pain affecting pregnancy in third       O26.893  trimester  [redacted] weeks gestation of pregnancy                Z3A.36  Encounter for uncertain dates                  Z36.87  Encounter for antenatal screening for          Z36.3  malformations  Hypertension - Chronic with superimposed       O11.9 O10.919  preeclampsia (severe range BPs in MAU)  Previous cesarean delivery, antepartum         O34.219 ---------------------------------------------------------------------- Fetal Evaluation  Num Of Fetuses:         1  Fetal Heart Rate(bpm):  132  Cardiac Activity:       Observed  Presentation:           Cephalic  Placenta:               Anterior  P. Cord Insertion:      Not well visualized  Amniotic Fluid  AFI FV:      Within normal limits  AFI Sum(cm)     %Tile       Largest Pocket(cm)  8.91            14          3.07  RUQ(cm)       RLQ(cm)       LUQ(cm)        LLQ(cm)  1.75          3.07          1.69           2.4  Comment:    No placental abruption or previa identified. ---------------------------------------------------------------------- Biophysical Evaluation  Amniotic F.V:   Within normal limits       F. Tone:        Observed  F. Movement:    Observed                   Score:          8/8  F. Breathing:   Observed ---------------------------------------------------------------------- Biometry  BPD:      85.9  mm     G. Age:  34w 4d         21  %    CI:        73.75   %    70 - 86  FL/HC:      22.0   %    20.1 - 22.1  HC:    317.72   mm     G. Age:  35w 5d         15  %    HC/AC:      0.94        0.93 - 1.11  AC:    337.87   mm     G. Age:  37w 5d         94  %    FL/BPD:     81.4   %    71 - 87  FL:       69.9  mm     G. Age:  35w 6d         41  %    FL/AC:      20.7   %    20 - 24  HUM:      59.8  mm     G.  Age:  34w 5d         39  %  Est. FW:    3000  gm    6 lb 10 oz      70  % ---------------------------------------------------------------------- OB History  Gravidity:    1         Term:   1        Prem:   0        SAB:   0  TOP:          0       Ectopic:  0        Living: 2 ---------------------------------------------------------------------- Gestational Age  U/S Today:     36w 0d                                        EDD:   01/28/22  Best:          36w 0d     Det. By:  U/S (12/31/21)           EDD:   01/28/22 ---------------------------------------------------------------------- Anatomy  Cranium:               Appears normal         LVOT:                   Not well visualized  Cavum:                 Appears normal         Aortic Arch:            Not well visualized  Ventricles:            Appears normal         Ductal Arch:            Not well visualized  Choroid Plexus:        Not well visualized    Diaphragm:              Appears normal  Cerebellum:            Not well visualized    Stomach:                Appears normal, left  sided  Posterior Fossa:       Not well visualized    Abdomen:                Appears normal  Nuchal Fold:           Not applicable (>20    Abdominal Wall:         Not well visualized                         wks GA)  Face:                  Not well visualized    Cord Vessels:           Appears normal (3                                                                        vessel cord)  Lips:                  Not well visualized    Kidneys:                Appear normal  Palate:                Not well visualized    Bladder:                Appears normal  Thoracic:              Appears normal         Spine:                  Not well visualized  Heart:                 Not well visualized    Upper Extremities:      Visualized  RVOT:                  Not well visualized    Lower Extremities:      Visualized  Other:   Technically difficult due to advanced GA and fetal position. ---------------------------------------------------------------------- Cervix Uterus Adnexa  Cervix  Not visualized (advanced GA >24wks)  Uterus  No abnormality visualized.  Right Ovary  Not visualized. No adnexal mass visualized.  Left Ovary  Within normal limits.  Adnexa  No abnormality visualized. ---------------------------------------------------------------------- Impression  Single intrauterine pregnancy here for a detailed anatomy  due pelvic pain and suspected preeclampsia.  Normal anatomy with measurements consistent with 36  weeks based on today's ultrasound.  There is good fetal movement and amniotic fluid volume  Suboptimal views of the fetal anatomy were obtained  secondary to fetal position. ---------------------------------------------------------------------- Recommendations  Management per inpatient provider. ----------------------------------------------------------------------              Lin Landsmanorenthian Booker, MD Electronically Signed Final Report   12/31/2021 11:39 am ----------------------------------------------------------------------   Future Appointments  Date Time Provider Department Center  01/05/2022 10:30 AM [facility] PAT 1 [facility] None    Discharge Condition: Stable  Discharge disposition: 01-Home or Self Care       Discharge Instructions     Discharge activity:  No Restrictions   Complete by: As directed  Discharge diet:  No restrictions   Complete by: As directed    Fetal Kick Count:  Lie on our left side for one hour after a meal, and count the number of times your baby kicks.  If it is less than 5 times, get up, move around and drink some juice.  Repeat the test 30 minutes later.  If it is still less than 5 kicks in an hour, notify your doctor.   Complete by: As directed       Allergies as of 01/02/2022   No Known Allergies      Medication List     STOP taking these medications    amLODipine 5  MG tablet Commonly known as: NORVASC   ibuprofen 800 MG tablet Commonly known as: ADVIL   TUMS PO       TAKE these medications    labetalol 200 MG tablet Commonly known as: NORMODYNE Take 2 tablets (400 mg total) by mouth 3 (three) times daily.   NIFEdipine 30 MG 24 hr tablet Commonly known as: ADALAT CC Take 1 tablet (30 mg total) by mouth 2 (two) times daily.   potassium chloride SA 20 MEQ tablet Commonly known as: KLOR-CON M Take 2 tablets (40 mEq total) by mouth 3 (three) times daily for 3 days.   prenatal multivitamin Tabs tablet Take 1 tablet by mouth daily at 12 noon.   scopolamine 1 MG/3DAYS Commonly known as: TRANSDERM-SCOP Place 1 patch (1.5 mg total) onto the skin once for 1 dose.         Total discharge time: 45 minutes   Signed: Reva Bores M.D. 01/02/2022, 3:07 PM

## 2022-01-03 ENCOUNTER — Telehealth: Payer: Self-pay | Admitting: Obstetrics and Gynecology

## 2022-01-03 NOTE — Telephone Encounter (Signed)
Per Dr. Shawnie Pons, the patient was instructed to return to MAU today for CMP and possible C/s if liver enzymes remained elevated. The patient was called around 1300 today. She reports she presented to MAU at 10:00 am and was told by registration that she did not have lab orders in for today. She had pre admit orders in but those were scheduled for next week.  The patient left MAU. She reports since leaving MAU she left [place] and did not have plans to return today. We discussed the reasoning for her return for blood work and the severity of preeclampsia with elevated liver enzymes including fetal demise, maternal seizure and maternal death. The patient voiced understanding of severity and voiced frustration that she was here in MAU this morning and was told she could leave because no labs were ordered or due. She did agree to return to MAU later this evening and she did agree to remain NPO. She was given a direct number to the MAU providers office and encouraged to call us if she had any problems at registration.   Duane Lope, NP 01/03/2022 5:28 PM

## 2022-01-04 ENCOUNTER — Telehealth: Payer: Self-pay | Admitting: Obstetrics and Gynecology

## 2022-01-04 NOTE — Telephone Encounter (Signed)
LVM to return call to MAU at [phone].  Raelyn Mora, CNM  01/04/2022 10:40 AM

## 2022-01-05 ENCOUNTER — Other Ambulatory Visit: Payer: Self-pay | Admitting: Family Medicine

## 2022-01-05 ENCOUNTER — Other Ambulatory Visit (HOSPITAL_COMMUNITY)
Admission: RE | Admit: 2022-01-05 | Discharge: 2022-01-05 | Disposition: A | Discharge status: Home / Self Care | Payer: BC Managed Care – PPO | Source: Ambulatory Visit | Attending: Obstetrics & Gynecology | Admitting: Obstetrics & Gynecology

## 2022-01-05 DIAGNOSIS — O0990 Supervision of high risk pregnancy, unspecified, unspecified trimester: Secondary | ICD-10-CM

## 2022-01-05 DIAGNOSIS — E876 Hypokalemia: Secondary | ICD-10-CM | POA: Diagnosis not present

## 2022-01-05 DIAGNOSIS — O34219 Maternal care for unspecified type scar from previous cesarean delivery: Secondary | ICD-10-CM | POA: Diagnosis not present

## 2022-01-05 DIAGNOSIS — O164 Unspecified maternal hypertension, complicating childbirth: Secondary | ICD-10-CM | POA: Diagnosis not present

## 2022-01-05 DIAGNOSIS — O99284 Endocrine, nutritional and metabolic diseases complicating childbirth: Secondary | ICD-10-CM | POA: Diagnosis not present

## 2022-01-05 DIAGNOSIS — O34211 Maternal care for low transverse scar from previous cesarean delivery: Secondary | ICD-10-CM | POA: Diagnosis not present

## 2022-01-05 DIAGNOSIS — Z01812 Encounter for preprocedural laboratory examination: Secondary | ICD-10-CM | POA: Insufficient documentation

## 2022-01-05 DIAGNOSIS — O1002 Pre-existing essential hypertension complicating childbirth: Secondary | ICD-10-CM | POA: Diagnosis not present

## 2022-01-05 DIAGNOSIS — O9902 Anemia complicating childbirth: Secondary | ICD-10-CM | POA: Diagnosis not present

## 2022-01-05 DIAGNOSIS — Z302 Encounter for sterilization: Secondary | ICD-10-CM | POA: Diagnosis not present

## 2022-01-05 DIAGNOSIS — Z87891 Personal history of nicotine dependence: Secondary | ICD-10-CM | POA: Diagnosis not present

## 2022-01-05 DIAGNOSIS — O99824 Streptococcus B carrier state complicating childbirth: Secondary | ICD-10-CM | POA: Diagnosis not present

## 2022-01-05 DIAGNOSIS — Z3A37 37 weeks gestation of pregnancy: Secondary | ICD-10-CM | POA: Diagnosis not present

## 2022-01-05 DIAGNOSIS — D649 Anemia, unspecified: Secondary | ICD-10-CM | POA: Diagnosis not present

## 2022-01-05 DIAGNOSIS — O114 Pre-existing hypertension with pre-eclampsia, complicating childbirth: Secondary | ICD-10-CM | POA: Diagnosis not present

## 2022-01-05 DIAGNOSIS — O1414 Severe pre-eclampsia complicating childbirth: Secondary | ICD-10-CM | POA: Diagnosis not present

## 2022-01-05 LAB — CBC
HCT: 32.8 % — ABNORMAL LOW (ref 36.0–46.0)
Hemoglobin: 10.7 g/dL — ABNORMAL LOW (ref 12.0–15.0)
MCH: 28.9 pg (ref 26.0–34.0)
MCHC: 32.6 g/dL (ref 30.0–36.0)
MCV: 88.6 fL (ref 80.0–100.0)
Platelets: 364 10*3/uL (ref 150–400)
RBC: 3.7 MIL/uL — ABNORMAL LOW (ref 3.87–5.11)
RDW: 18.5 % — ABNORMAL HIGH (ref 11.5–15.5)
WBC: 8.2 10*3/uL (ref 4.0–10.5)
nRBC: 0 % (ref 0.0–0.2)

## 2022-01-05 LAB — TYPE AND SCREEN
ABO/RH(D): B POS
Antibody Screen: NEGATIVE

## 2022-01-05 LAB — RPR: RPR Ser Ql: NONREACTIVE

## 2022-01-05 NOTE — H&P (Shared)
OBSTETRIC ADMISSION HISTORY AND PHYSICAL  Monique [name] is a 31 y.o. female [phone] with IUP at [redacted]w[redacted]d*** by 36 week [place] presenting for scheduled repeat cesarean section. She reports +FMs, no LOF, no VB, no blurry vision, headaches, peripheral edema, or RUQ pain.  She plans on bottle feeding. She requests BTS*** for birth control postpartum.  Dating: By [place] --->  Estimated Date of Delivery: 01/28/22  Sono:   @[redacted]w[redacted]d , normal anatomy, cephalic presentation, anterior placental lie, 3000 g, 70% EFW  Prenatal History/Complications:  Limited prenatal care Chronic HTN Elevated LFTs  Past Medical History: Past Medical History:  Diagnosis Date   Asthma     Past Surgical History: Past Surgical History:  Procedure Laterality Date   CESAREAN SECTION N/A 09/01/2013   Procedure: CESAREAN SECTION;  Surgeon: 09/03/2013, MD;  Location: [facility];  Service: Obstetrics;  Laterality: N/A;   NO PAST SURGERIES      Obstetrical History: OB History     Gravida  3   Para  1   Term  1   Preterm      AB  1   Living  2      SAB      IAB      Ectopic      Multiple  1   Live Births  2           Social History Social History   Socioeconomic History   Marital status: Single    Spouse name: Not on file   Number of children: Not on file   Years of education: Not on file   Highest education level: Not on file  Occupational History   Not on file  Tobacco Use   Smoking status: Former    Types: Cigars   Smokeless tobacco: Never  Vaping Use   Vaping Use: Never used  Substance and Sexual Activity   Alcohol use: No   Drug use: No   Sexual activity: Yes    Partners: Male  Other Topics Concern   Not on file  Social History Narrative   Not on file   Social Determinants of Health   Financial Resource Strain: Not on file  Food Insecurity: Not on file  Transportation Needs: Not on file  Physical Activity: Not on file  Stress: Not on file  Social Connections: Not  on file    Family History: Family History  Problem Relation Age of Onset   Hypertension Mother    Diabetes Mother    Hypertension Father    Diabetes Father     Allergies: No Known Allergies  No medications prior to admission.     Review of Systems  All systems reviewed and negative except as stated in HPI  Blood pressure 138/85, pulse 83, temperature 98 F (36.7 C), temperature source Oral, resp. rate 17, height 5\' 4"  (1.626 m), weight 99.8 kg, SpO2 100 %.  General appearance: alert, cooperative, and no distress Lungs: normal work of breathing on room air  Heart: normal rate, warm and well perfused  Abdomen: soft, non-tender, gravid  Extremities: no LE edema or calf tenderness to palpation   Prenatal labs: ABO, Rh: --/--/B POS (06/28 0114) Antibody: NEG (06/28 0114) Rubella: 4.72 (06/28 0251) RPR: NON REACTIVE (06/28 0251)  HBsAg: NON REACTIVE (06/28 0114)  HIV: NON REACTIVE (06/28 0114)  GBS: POSITIVE/-- (06/28 0100)  GTT not done; A1c normal on 12/31/21  Genetic screening - Not done, too late Anatomy 12-29-1977 normal   Prenatal Transfer Tool  Maternal Diabetes: No Genetic Screening: Not done  Maternal Ultrasounds/Referrals: Normal Fetal Ultrasounds or other Referrals:  None Maternal Substance Abuse:  No Significant Maternal Medications:  Procardia 30 mg, Labetalol 400 mg TID Significant Maternal Lab Results: Group B Strep positive  No results found for this or any previous visit (from the past 24 hour(s)).  Patient Active Problem List   Diagnosis Date Noted   Hypokalemia 01/02/2022   Abnormal liver function tests 01/02/2022   Previous cesarean delivery affecting pregnancy, antepartum 01/02/2022   Limited prenatal care 01/02/2022   Conjunctivitis 01/02/2022   Chronic hypertension affecting pregnancy 12/31/2021   Unspecified hypertension, postpartum condition or complication 09/11/2013   GERD without esophagitis 08/15/2013    Assessment/Plan:  Monique  [name] is a 31 y.o. [id number] at [redacted]w[redacted]d here for scheduled repeat cesarean section.   The risks of surgery were discussed with the patient including but were not limited to: bleeding which may require transfusion or reoperation; infection which may require antibiotics; injury to bowel, bladder, ureters or other surrounding organs; injury to the fetus; need for additional procedures including hysterectomy in the event of a life-threatening hemorrhage; formation of adhesions; placental abnormalities with subsequent pregnancies; incisional problems; thromboembolic phenomenon and other postoperative/anesthesia complications.  The patient concurred with the proposed plan, giving informed written consent for the procedure.   Patient has been NPO since *** she will remain NPO for procedure. Anesthesia and OR aware. Preoperative prophylactic antibiotics and SCDs ordered on call to the OR.  To OR when ready.  Patient desires permanent sterilization.  Other reversible forms of contraception were discussed with patient; she declines all other modalities. Risks of procedure discussed with patient including but not limited to: risk of regret, permanence of method, bleeding, infection, injury to surrounding organs and need for additional procedures.  Failure risk of about 1% with increased risk of ectopic gestation if pregnancy occurs was also discussed with patient.  Also discussed possibility of post-tubal pain syndrome. Patient verbalized understanding of these risks and wants to proceed with sterilization.  Written informed consent obtained.  To OR when ready.  #Pain: Per anesthesia  #ID:  GBS positive; Ancef for surgical prophylaxis  #MOF: Bottle  #MOC: BTS   #Circ:  ***  #cHTN: - Will continue Procardia 30 mg and Labetalol 400 mg TID, plan to titrate as needed postpartum - Start Lasix for 5 days postpartum   #Elevated LFTs: - Last stable on 6/30 - Repeat CMP ordered   #Limited PNC: - Found out she  was pregnant at [redacted] weeks - Plan for SW consult postpartum for additional resources   Worthy Rancher, MD  01/05/2022, 7:15 AM

## 2022-01-06 ENCOUNTER — Encounter (HOSPITAL_COMMUNITY): Payer: Self-pay | Admitting: Anesthesiology

## 2022-01-06 NOTE — Anesthesia Preprocedure Evaluation (Signed)
Anesthesia Evaluation    History of Anesthesia Complications Negative for: history of anesthetic complications  Airway        Dental   Pulmonary former smoker,  Childhood asthma resolved, no inhalers.           Cardiovascular hypertension, Pt. on medications and Pt. on home beta blockers   Pre eclampsia   Neuro/Psych negative neurological ROS  negative psych ROS   GI/Hepatic Neg liver ROS, GERD  Medicated and Controlled,  Endo/Other  negative endocrine ROS  Renal/GU negative Renal ROS     Musculoskeletal negative musculoskeletal ROS (+)   Abdominal (+) + obese,   Peds  Hematology  (+) Blood dyscrasia, anemia ,   Anesthesia Other Findings   Reproductive/Obstetrics (+) Pregnancy                             Anesthesia Physical  Anesthesia Plan  ASA: 3  Anesthesia Plan: Spinal   Post-op Pain Management: Minimal or no pain anticipated   Induction: Intravenous  PONV Risk Score and Plan: 2 and Treatment may vary due to age or medical condition and Scopolamine patch - Pre-op  Airway Management Planned: Natural Airway  Additional Equipment: None  Intra-op Plan:   Post-operative Plan:   Informed Consent: I have reviewed the patients History and Physical, chart, labs and discussed the procedure including the risks, benefits and alternatives for the proposed anesthesia with the patient or authorized representative who has indicated his/her understanding and acceptance.     Dental advisory given  Plan Discussed with: CRNA and Anesthesiologist  Anesthesia Plan Comments: (  )        Anesthesia Quick Evaluation

## 2022-01-07 ENCOUNTER — Inpatient Hospital Stay (HOSPITAL_COMMUNITY)
Admission: AD | Admit: 2022-01-07 | Payer: BC Managed Care – PPO | Source: Home / Self Care | Admitting: Obstetrics & Gynecology

## 2022-01-07 ENCOUNTER — Inpatient Hospital Stay (HOSPITAL_COMMUNITY)
Admission: RE | Admit: 2022-01-07 | Discharge: 2022-01-09 | DRG: 784 | Disposition: A | Discharge status: Home / Self Care | Payer: BC Managed Care – PPO | Attending: Obstetrics & Gynecology | Admitting: Obstetrics & Gynecology

## 2022-01-07 ENCOUNTER — Inpatient Hospital Stay (HOSPITAL_COMMUNITY): Payer: BC Managed Care – PPO | Admitting: Anesthesiology

## 2022-01-07 ENCOUNTER — Other Ambulatory Visit: Payer: Self-pay

## 2022-01-07 ENCOUNTER — Encounter (HOSPITAL_COMMUNITY)
Admission: RE | Disposition: A | Discharge status: Home / Self Care | Payer: Self-pay | Source: Home / Self Care | Attending: Obstetrics & Gynecology

## 2022-01-07 ENCOUNTER — Encounter (HOSPITAL_COMMUNITY): Payer: Self-pay | Admitting: Obstetrics & Gynecology

## 2022-01-07 ENCOUNTER — Encounter (HOSPITAL_COMMUNITY)
Admission: AD | Disposition: A | Discharge status: Home / Self Care | Payer: Self-pay | Source: Home / Self Care | Attending: Family Medicine

## 2022-01-07 DIAGNOSIS — Z3A37 37 weeks gestation of pregnancy: Secondary | ICD-10-CM

## 2022-01-07 DIAGNOSIS — E876 Hypokalemia: Secondary | ICD-10-CM | POA: Diagnosis present

## 2022-01-07 DIAGNOSIS — O34211 Maternal care for low transverse scar from previous cesarean delivery: Principal | ICD-10-CM | POA: Diagnosis present

## 2022-01-07 DIAGNOSIS — Z302 Encounter for sterilization: Secondary | ICD-10-CM

## 2022-01-07 DIAGNOSIS — Z87891 Personal history of nicotine dependence: Secondary | ICD-10-CM | POA: Diagnosis not present

## 2022-01-07 DIAGNOSIS — O0930 Supervision of pregnancy with insufficient antenatal care, unspecified trimester: Secondary | ICD-10-CM

## 2022-01-07 DIAGNOSIS — O114 Pre-existing hypertension with pre-eclampsia, complicating childbirth: Secondary | ICD-10-CM | POA: Diagnosis present

## 2022-01-07 DIAGNOSIS — Z3490 Encounter for supervision of normal pregnancy, unspecified, unspecified trimester: Secondary | ICD-10-CM

## 2022-01-07 DIAGNOSIS — O10919 Unspecified pre-existing hypertension complicating pregnancy, unspecified trimester: Secondary | ICD-10-CM | POA: Diagnosis present

## 2022-01-07 DIAGNOSIS — Z9079 Acquired absence of other genital organ(s): Secondary | ICD-10-CM

## 2022-01-07 DIAGNOSIS — Z98891 History of uterine scar from previous surgery: Principal | ICD-10-CM

## 2022-01-07 DIAGNOSIS — O99284 Endocrine, nutritional and metabolic diseases complicating childbirth: Secondary | ICD-10-CM | POA: Diagnosis present

## 2022-01-07 DIAGNOSIS — O1002 Pre-existing essential hypertension complicating childbirth: Secondary | ICD-10-CM | POA: Diagnosis present

## 2022-01-07 DIAGNOSIS — O99824 Streptococcus B carrier state complicating childbirth: Secondary | ICD-10-CM | POA: Diagnosis present

## 2022-01-07 DIAGNOSIS — O1414 Severe pre-eclampsia complicating childbirth: Secondary | ICD-10-CM

## 2022-01-07 DIAGNOSIS — O34219 Maternal care for unspecified type scar from previous cesarean delivery: Secondary | ICD-10-CM | POA: Diagnosis present

## 2022-01-07 DIAGNOSIS — R7989 Other specified abnormal findings of blood chemistry: Secondary | ICD-10-CM | POA: Diagnosis present

## 2022-01-07 HISTORY — PX: BILATERAL SALPINGECTOMY: SHX5743

## 2022-01-07 LAB — COMPREHENSIVE METABOLIC PANEL
ALT: 29 U/L (ref 0–44)
AST: 29 U/L (ref 15–41)
Albumin: 2 g/dL — ABNORMAL LOW (ref 3.5–5.0)
Alkaline Phosphatase: 132 U/L — ABNORMAL HIGH (ref 38–126)
Anion gap: 10 (ref 5–15)
BUN: <5 mg/dL — ABNORMAL LOW (ref 6–20)
CO2: 20 mmol/L — ABNORMAL LOW (ref 22–32)
Calcium: 6.9 mg/dL — ABNORMAL LOW (ref 8.9–10.3)
Chloride: 109 mmol/L (ref 98–111)
Creatinine, Ser: 0.87 mg/dL (ref 0.44–1.00)
GFR, Estimated: >60 mL/min (ref >60)
Glucose, Bld: 95 mg/dL (ref 70–99)
Potassium: 3.2 mmol/L — ABNORMAL LOW (ref 3.5–5.1)
Sodium: 139 mmol/L (ref 135–145)
Total Bilirubin: 0.5 mg/dL (ref 0.3–1.2)
Total Protein: 5.1 g/dL — ABNORMAL LOW (ref 6.5–8.1)

## 2022-01-07 LAB — CBC
HCT: 29.7 % — ABNORMAL LOW (ref 36.0–46.0)
Hemoglobin: 9.8 g/dL — ABNORMAL LOW (ref 12.0–15.0)
MCH: 29.4 pg (ref 26.0–34.0)
MCHC: 33 g/dL (ref 30.0–36.0)
MCV: 89.2 fL (ref 80.0–100.0)
Platelets: 318 10*3/uL (ref 150–400)
RBC: 3.33 MIL/uL — ABNORMAL LOW (ref 3.87–5.11)
RDW: 18.6 % — ABNORMAL HIGH (ref 11.5–15.5)
WBC: 11 10*3/uL — ABNORMAL HIGH (ref 4.0–10.5)
nRBC: 0 % (ref 0.0–0.2)

## 2022-01-07 LAB — PROTEIN / CREATININE RATIO, URINE
Creatinine, Urine: 84.35 mg/dL
Protein Creatinine Ratio: 0.2 mg/mg{Cre} — ABNORMAL HIGH (ref 0.00–0.15)
Total Protein, Urine: 17 mg/dL

## 2022-01-07 SURGERY — Surgical Case
Anesthesia: Regional

## 2022-01-07 SURGERY — Surgical Case
Anesthesia: Spinal

## 2022-01-07 MED ORDER — ACETAMINOPHEN 160 MG/5ML PO SOLN: 325.0000 mg | ORAL | Status: DC | PRN

## 2022-01-07 MED ORDER — DEXAMETHASONE SODIUM PHOSPHATE 4 MG/ML IJ SOLN
INTRAMUSCULAR | Status: DC | PRN
  Administered 2022-01-07: 8 mg via INTRAVENOUS

## 2022-01-07 MED ORDER — PRENATAL MULTIVITAMIN CH
1.0000 | ORAL_TABLET | Freq: Every day | ORAL | Status: DC
  Administered 2022-01-08: 1 via ORAL
  Filled 2022-01-07 (×2): qty 1

## 2022-01-07 MED ORDER — MEPERIDINE HCL 25 MG/ML IJ SOLN: 6.2500 mg | INTRAMUSCULAR | Status: DC | PRN

## 2022-01-07 MED ORDER — ONDANSETRON HCL 4 MG/2ML IJ SOLN
4.0000 mg | Freq: Once | INTRAMUSCULAR | Status: DC | PRN
Start: 2022-01-07 — End: 2022-01-07

## 2022-01-07 MED ORDER — KETOROLAC TROMETHAMINE 30 MG/ML IJ SOLN
30.0000 mg | Freq: Four times a day (QID) | INTRAMUSCULAR | Status: AC
  Administered 2022-01-07 – 2022-01-08 (×2): 30 mg via INTRAVENOUS
  Filled 2022-01-07 (×3): qty 1

## 2022-01-07 MED ORDER — OXYTOCIN-SODIUM CHLORIDE 30-0.9 UT/500ML-% IV SOLN: 2.5000 [IU]/h | INTRAVENOUS | Status: AC

## 2022-01-07 MED ORDER — FENTANYL CITRATE (PF) 100 MCG/2ML IJ SOLN
INTRAMUSCULAR | Status: AC
  Filled 2022-01-07: qty 2

## 2022-01-07 MED ORDER — OXYTOCIN-SODIUM CHLORIDE 30-0.9 UT/500ML-% IV SOLN
INTRAVENOUS | Status: DC | PRN
  Administered 2022-01-07: 300 mL via INTRAVENOUS

## 2022-01-07 MED ORDER — BUPIVACAINE IN DEXTROSE 0.75-8.25 % IT SOLN
INTRATHECAL | Status: DC | PRN
  Administered 2022-01-07: 1.6 mL via INTRATHECAL

## 2022-01-07 MED ORDER — TETANUS-DIPHTH-ACELL PERTUSSIS 5-2.5-18.5 LF-MCG/0.5 IM SUSY: 0.5000 mL | PREFILLED_SYRINGE | Freq: Once | INTRAMUSCULAR | Status: DC

## 2022-01-07 MED ORDER — ACETAMINOPHEN 10 MG/ML IV SOLN
INTRAVENOUS | Status: DC | PRN
  Administered 2022-01-07: 1000 mg via INTRAVENOUS

## 2022-01-07 MED ORDER — LACTATED RINGERS IV SOLN: INTRAVENOUS | Status: DC

## 2022-01-07 MED ORDER — MENTHOL 3 MG MT LOZG: 1.0000 | LOZENGE | OROMUCOSAL | Status: DC | PRN

## 2022-01-07 MED ORDER — SODIUM CHLORIDE 0.9 % IR SOLN
Status: DC | PRN
  Administered 2022-01-07: 1

## 2022-01-07 MED ORDER — MORPHINE SULFATE (PF) 0.5 MG/ML IJ SOLN
INTRAMUSCULAR | Status: DC | PRN
  Administered 2022-01-07: 150 ug via INTRATHECAL

## 2022-01-07 MED ORDER — STERILE WATER FOR IRRIGATION IR SOLN
Status: DC | PRN
  Administered 2022-01-07: 1000 mL

## 2022-01-07 MED ORDER — METOCLOPRAMIDE HCL 5 MG/ML IJ SOLN
INTRAMUSCULAR | Status: AC
  Filled 2022-01-07: qty 2

## 2022-01-07 MED ORDER — NIFEDIPINE ER OSMOTIC RELEASE 30 MG PO TB24
30.0000 mg | ORAL_TABLET | Freq: Once | ORAL | Status: AC
  Administered 2022-01-07: 30 mg via ORAL

## 2022-01-07 MED ORDER — POTASSIUM CHLORIDE CRYS ER 20 MEQ PO TBCR
40.0000 meq | EXTENDED_RELEASE_TABLET | Freq: Three times a day (TID) | ORAL | Status: DC
Start: 2022-01-07 — End: 2022-01-09
  Administered 2022-01-07 – 2022-01-09 (×6): 40 meq via ORAL
  Filled 2022-01-07 (×8): qty 2

## 2022-01-07 MED ORDER — SENNOSIDES-DOCUSATE SODIUM 8.6-50 MG PO TABS
2.0000 | ORAL_TABLET | Freq: Every day | ORAL | Status: DC
  Administered 2022-01-08 – 2022-01-09 (×2): 2 via ORAL
  Filled 2022-01-07 (×2): qty 2

## 2022-01-07 MED ORDER — ONDANSETRON HCL 4 MG/2ML IJ SOLN
INTRAMUSCULAR | Status: AC
  Filled 2022-01-07: qty 2

## 2022-01-07 MED ORDER — OXYCODONE HCL 5 MG PO TABS: 5.0000 mg | ORAL_TABLET | ORAL | Status: DC | PRN

## 2022-01-07 MED ORDER — DIBUCAINE (PERIANAL) 1 % EX OINT
1.0000 | TOPICAL_OINTMENT | CUTANEOUS | Status: DC | PRN
Start: 2022-01-07 — End: 2022-01-09

## 2022-01-07 MED ORDER — OXYCODONE HCL 5 MG/5ML PO SOLN: 5.0000 mg | Freq: Once | ORAL | Status: DC | PRN

## 2022-01-07 MED ORDER — ENOXAPARIN SODIUM 60 MG/0.6ML IJ SOSY: 50.0000 mg | PREFILLED_SYRINGE | INTRAMUSCULAR | Status: DC

## 2022-01-07 MED ORDER — NIFEDIPINE ER OSMOTIC RELEASE 30 MG PO TB24
30.0000 mg | ORAL_TABLET | Freq: Two times a day (BID) | ORAL | Status: DC
Start: 2022-01-07 — End: 2022-01-07

## 2022-01-07 MED ORDER — SIMETHICONE 80 MG PO CHEW
80.0000 mg | CHEWABLE_TABLET | Freq: Three times a day (TID) | ORAL | Status: DC
  Administered 2022-01-08 – 2022-01-09 (×4): 80 mg via ORAL
  Filled 2022-01-07 (×5): qty 1

## 2022-01-07 MED ORDER — BUPIVACAINE HCL (PF) 0.5 % IJ SOLN
INTRAMUSCULAR | Status: AC
  Filled 2022-01-07: qty 30

## 2022-01-07 MED ORDER — CEFAZOLIN SODIUM-DEXTROSE 2-4 GM/100ML-% IV SOLN
INTRAVENOUS | Status: AC
  Filled 2022-01-07: qty 100

## 2022-01-07 MED ORDER — DEXAMETHASONE SODIUM PHOSPHATE 4 MG/ML IJ SOLN
INTRAMUSCULAR | Status: AC
  Filled 2022-01-07: qty 2

## 2022-01-07 MED ORDER — PHENYLEPHRINE HCL-NACL 20-0.9 MG/250ML-% IV SOLN
INTRAVENOUS | Status: AC
  Filled 2022-01-07: qty 250

## 2022-01-07 MED ORDER — PHENYLEPHRINE HCL-NACL 20-0.9 MG/250ML-% IV SOLN
INTRAVENOUS | Status: DC | PRN
  Administered 2022-01-07: 60 ug/min via INTRAVENOUS

## 2022-01-07 MED ORDER — POVIDONE-IODINE 10 % EX SWAB
2.0000 | Freq: Once | CUTANEOUS | Status: AC
  Administered 2022-01-07: 2 via TOPICAL

## 2022-01-07 MED ORDER — PHENYLEPHRINE HCL (PRESSORS) 10 MG/ML IV SOLN
INTRAVENOUS | Status: DC | PRN
  Administered 2022-01-07: 80 ug via INTRAVENOUS
  Administered 2022-01-07 (×3): 160 ug via INTRAVENOUS

## 2022-01-07 MED ORDER — SOD CITRATE-CITRIC ACID 500-334 MG/5ML PO SOLN
ORAL | Status: AC
  Filled 2022-01-07: qty 30

## 2022-01-07 MED ORDER — ACETAMINOPHEN 10 MG/ML IV SOLN
INTRAVENOUS | Status: AC
  Filled 2022-01-07: qty 100

## 2022-01-07 MED ORDER — WITCH HAZEL-GLYCERIN EX PADS: 1.0000 | MEDICATED_PAD | CUTANEOUS | Status: DC | PRN

## 2022-01-07 MED ORDER — PHENYLEPHRINE 80 MCG/ML (10ML) SYRINGE FOR IV PUSH (FOR BLOOD PRESSURE SUPPORT)
PREFILLED_SYRINGE | INTRAVENOUS | Status: AC
  Filled 2022-01-07: qty 10

## 2022-01-07 MED ORDER — MEASLES, MUMPS & RUBELLA VAC IJ SOLR: 0.5000 mL | Freq: Once | INTRAMUSCULAR | Status: DC

## 2022-01-07 MED ORDER — NIFEDIPINE ER OSMOTIC RELEASE 30 MG PO TB24
60.0000 mg | ORAL_TABLET | Freq: Every day | ORAL | Status: DC
Start: 2022-01-08 — End: 2022-01-09
  Administered 2022-01-08: 60 mg via ORAL
  Filled 2022-01-07: qty 2

## 2022-01-07 MED ORDER — IBUPROFEN 600 MG PO TABS
600.0000 mg | ORAL_TABLET | Freq: Four times a day (QID) | ORAL | Status: DC
  Administered 2022-01-08 – 2022-01-09 (×4): 600 mg via ORAL
  Filled 2022-01-07 (×4): qty 1

## 2022-01-07 MED ORDER — FENTANYL CITRATE (PF) 100 MCG/2ML IJ SOLN
25.0000 ug | INTRAMUSCULAR | Status: DC | PRN
  Administered 2022-01-07: 50 ug via INTRAVENOUS

## 2022-01-07 MED ORDER — COCONUT OIL OIL: 1.0000 | TOPICAL_OIL | Status: DC | PRN

## 2022-01-07 MED ORDER — MORPHINE SULFATE (PF) 0.5 MG/ML IJ SOLN
INTRAMUSCULAR | Status: AC
  Filled 2022-01-07: qty 10

## 2022-01-07 MED ORDER — ONDANSETRON HCL 4 MG/2ML IJ SOLN
INTRAMUSCULAR | Status: DC | PRN
  Administered 2022-01-07: 4 mg via INTRAVENOUS

## 2022-01-07 MED ORDER — SOD CITRATE-CITRIC ACID 500-334 MG/5ML PO SOLN
30.0000 mL | ORAL | Status: AC
  Administered 2022-01-07: 30 mL via ORAL

## 2022-01-07 MED ORDER — OXYCODONE HCL 5 MG PO TABS: 5.0000 mg | ORAL_TABLET | Freq: Once | ORAL | Status: DC | PRN

## 2022-01-07 MED ORDER — ACETAMINOPHEN 500 MG PO TABS
1000.0000 mg | ORAL_TABLET | Freq: Four times a day (QID) | ORAL | Status: DC
  Administered 2022-01-07 – 2022-01-09 (×7): 1000 mg via ORAL
  Filled 2022-01-07 (×7): qty 2

## 2022-01-07 MED ORDER — SIMETHICONE 80 MG PO CHEW: 80.0000 mg | CHEWABLE_TABLET | ORAL | Status: DC | PRN

## 2022-01-07 MED ORDER — CEFAZOLIN SODIUM-DEXTROSE 2-4 GM/100ML-% IV SOLN
2.0000 g | INTRAVENOUS | Status: AC
Start: 2022-01-07 — End: 2022-01-07
  Administered 2022-01-07: 2 g via INTRAVENOUS

## 2022-01-07 MED ORDER — NIFEDIPINE ER OSMOTIC RELEASE 30 MG PO TB24
30.0000 mg | ORAL_TABLET | Freq: Two times a day (BID) | ORAL | Status: DC
  Administered 2022-01-07: 30 mg via ORAL
  Filled 2022-01-07 (×2): qty 1

## 2022-01-07 MED ORDER — DIPHENHYDRAMINE HCL 25 MG PO CAPS: 25.0000 mg | ORAL_CAPSULE | Freq: Four times a day (QID) | ORAL | Status: DC | PRN

## 2022-01-07 MED ORDER — LACTATED RINGERS IV SOLN
INTRAVENOUS | Status: DC
Start: 2022-01-07 — End: 2022-01-07

## 2022-01-07 MED ORDER — ACETAMINOPHEN 325 MG PO TABS: 325.0000 mg | ORAL_TABLET | ORAL | Status: DC | PRN

## 2022-01-07 MED ORDER — METOCLOPRAMIDE HCL 5 MG/ML IJ SOLN
INTRAMUSCULAR | Status: DC | PRN
  Administered 2022-01-07: 5 mg via INTRAVENOUS

## 2022-01-07 MED ORDER — FENTANYL CITRATE (PF) 100 MCG/2ML IJ SOLN
INTRAMUSCULAR | Status: DC | PRN
Start: 2022-01-07 — End: 2022-01-07
  Administered 2022-01-07: 15 ug via INTRATHECAL

## 2022-01-07 MED ORDER — OXYTOCIN-SODIUM CHLORIDE 30-0.9 UT/500ML-% IV SOLN
INTRAVENOUS | Status: AC
  Filled 2022-01-07: qty 500

## 2022-01-07 SURGICAL SUPPLY — 37 items
BARRIER ADHS 3X4 INTERCEED (GAUZE/BANDAGES/DRESSINGS) IMPLANT
BENZOIN TINCTURE PRP APPL 2/3 (GAUZE/BANDAGES/DRESSINGS) ×1 IMPLANT
CHLORAPREP W/TINT 26ML (MISCELLANEOUS) ×6 IMPLANT
CLAMP CORD UMBIL (MISCELLANEOUS) ×3 IMPLANT
CLIP FILSHIE TUBAL LIGA STRL (Clip) IMPLANT
CLOTH BEACON ORANGE TIMEOUT ST (SAFETY) ×3 IMPLANT
DRSG OPSITE POSTOP 4X10 (GAUZE/BANDAGES/DRESSINGS) ×3 IMPLANT
ELECT REM PT RETURN 9FT ADLT (ELECTROSURGICAL) ×3
ELECTRODE REM PT RTRN 9FT ADLT (ELECTROSURGICAL) ×2 IMPLANT
EXTRACTOR VACUUM KIWI (MISCELLANEOUS) IMPLANT
GLOVE BIO SURGEON STRL SZ 6.5 (GLOVE) ×3 IMPLANT
GLOVE BIOGEL PI IND STRL 7.0 (GLOVE) ×4 IMPLANT
GLOVE BIOGEL PI INDICATOR 7.0 (GLOVE) ×2
GOWN STRL REUS W/TWL LRG LVL3 (GOWN DISPOSABLE) ×6 IMPLANT
KIT ABG SYR 3ML LUER SLIP (SYRINGE) IMPLANT
NDL HYPO 25X5/8 SAFETYGLIDE (NEEDLE) IMPLANT
NEEDLE HYPO 22GX1.5 SAFETY (NEEDLE) IMPLANT
NEEDLE HYPO 25X5/8 SAFETYGLIDE (NEEDLE) IMPLANT
NS IRRIG 1000ML POUR BTL (IV SOLUTION) ×3 IMPLANT
PACK C SECTION WH (CUSTOM PROCEDURE TRAY) ×3 IMPLANT
PAD OB MATERNITY 4.3X12.25 (PERSONAL CARE ITEMS) ×3 IMPLANT
RETRACTOR WND ALEXIS 25 LRG (MISCELLANEOUS) IMPLANT
RTRCTR WOUND ALEXIS 25CM LRG (MISCELLANEOUS)
STRIP CLOSURE SKIN 1/2X4 (GAUZE/BANDAGES/DRESSINGS) ×1 IMPLANT
SUT PLAIN 0 NONE (SUTURE) ×1 IMPLANT
SUT PLAIN 2 0 (SUTURE) ×1
SUT PLAIN ABS 2-0 CT1 27XMFL (SUTURE) IMPLANT
SUT VIC AB 0 CT1 36 (SUTURE) ×18 IMPLANT
SUT VIC AB 2-0 CT1 27 (SUTURE) ×1
SUT VIC AB 2-0 CT1 TAPERPNT 27 (SUTURE) ×2 IMPLANT
SUT VIC AB 4-0 PS2 27 (SUTURE) ×3 IMPLANT
SUT VIC AB 4-0 SH 27 (SUTURE) ×1
SUT VIC AB 4-0 SH 27XANBCTRL (SUTURE) IMPLANT
SYR CONTROL 10ML LL (SYRINGE) IMPLANT
TOWEL OR 17X24 6PK STRL BLUE (TOWEL DISPOSABLE) ×3 IMPLANT
TRAY FOLEY W/BAG SLVR 14FR LF (SET/KITS/TRAYS/PACK) IMPLANT
WATER STERILE IRR 1000ML POUR (IV SOLUTION) ×3 IMPLANT

## 2022-01-07 NOTE — Anesthesia Postprocedure Evaluation (Signed)
Anesthesia Post Note  Patient: Monique [name]  Procedure(s) Performed: CESAREAN SECTION OPEN BILATERAL SALPINGECTOMY (Bilateral)     Patient location during evaluation: PACU Anesthesia Type: Spinal Level of consciousness: oriented and awake and alert Pain management: pain level controlled Vital Signs Assessment: post-procedure vital signs reviewed and stable Respiratory status: spontaneous breathing, respiratory function stable and patient connected to nasal cannula oxygen Cardiovascular status: blood pressure returned to baseline and stable Postop Assessment: no headache, no backache and no apparent nausea or vomiting Anesthetic complications: no   No notable events documented.  Last Vitals:  Vitals:   01/07/22 1501 01/07/22 1536  BP: (!) 159/94 (!) 149/90  Pulse: 74   Resp:    Temp: 36.6 C   SpO2: 99%     Last Pain:  Vitals:   01/07/22 2011  TempSrc:   PainSc: 3                  Jaymarion Trombly

## 2022-01-07 NOTE — H&P (Signed)
OBSTETRIC ADMISSION HISTORY AND PHYSICAL  Monique [name] is a 31 y.o. female [phone] with IUP at [redacted]w[redacted]d by 36 week [place] presenting for scheduled repeat cesarean section. She reports +FMs, no LOF, no VB, no blurry vision, headaches, peripheral edema, or RUQ pain.  She plans on bottle feeding. She requests BTL for birth control postpartum.  Dating: By [place] --->  Estimated Date of Delivery: 01/28/22  Sono:   @[redacted]w[redacted]d , normal anatomy, cephalic presentation, anterior placental lie, 3000 g, 70% EFW  Prenatal History/Complications:  Limited prenatal care Chronic HTN Elevated LFTs  Past Medical History: Past Medical History:  Diagnosis Date   Asthma     Past Surgical History: Past Surgical History:  Procedure Laterality Date   CESAREAN SECTION N/A 09/01/2013   Procedure: CESAREAN SECTION;  Surgeon: 09/03/2013, MD;  Location: [facility];  Service: Obstetrics;  Laterality: N/A;   NO PAST SURGERIES      Obstetrical History: OB History     Gravida  3   Para  1   Term  1   Preterm      AB  1   Living  2      SAB      IAB      Ectopic      Multiple  1   Live Births  2           Social History Social History   Socioeconomic History   Marital status: Single    Spouse name: Not on file   Number of children: Not on file   Years of education: Not on file   Highest education level: Not on file  Occupational History   Not on file  Tobacco Use   Smoking status: Former    Types: Cigars   Smokeless tobacco: Never  Vaping Use   Vaping Use: Never used  Substance and Sexual Activity   Alcohol use: No   Drug use: No   Sexual activity: Yes    Partners: Male  Other Topics Concern   Not on file  Social History Narrative   Not on file   Social Determinants of Health   Financial Resource Strain: Not on file  Food Insecurity: Not on file  Transportation Needs: Not on file  Physical Activity: Not on file  Stress: Not on file  Social Connections: Not on  file    Family History: Family History  Problem Relation Age of Onset   Hypertension Mother    Diabetes Mother    Hypertension Father    Diabetes Father     Allergies: No Known Allergies  Medications Prior to Admission  Medication Sig Dispense Refill Last Dose   labetalol (NORMODYNE) 200 MG tablet Take 2 tablets (400 mg total) by mouth 3 (three) times daily. 30 tablet 0 01/07/2022   NIFEdipine (ADALAT CC) 30 MG 24 hr tablet Take 1 tablet (30 mg total) by mouth 2 (two) times daily. 60 tablet 3 01/07/2022   potassium chloride SA (KLOR-CON M) 20 MEQ tablet Take 2 tablets (40 mEq total) by mouth 3 (three) times daily for 3 days. 18 tablet 0    Prenatal Vit-Fe Fumarate-FA (PRENATAL MULTIVITAMIN) TABS tablet Take 1 tablet by mouth daily at 12 noon. (Patient not taking: Reported on 12/31/2021)        Review of Systems  All systems reviewed and negative except as stated in HPI  Blood pressure 130/75, pulse 88, temperature 97.9 F (36.6 C), temperature source Oral, resp. rate 16, height 5\' 4"  (1.626  m), weight 105.6 kg, SpO2 100 %.  General appearance: alert, cooperative, and no distress Lungs: normal work of breathing on room air  Heart: normal rate, warm and well perfused  Abdomen: soft, non-tender, gravid  Extremities: no LE edema or calf tenderness to palpation   Prenatal labs: ABO, Rh: --/--/B POS (07/03 1058) Antibody: NEG (07/03 1058) Rubella: 4.72 (06/28 0251) RPR: NON REACTIVE (07/03 1058)  HBsAg: NON REACTIVE (06/28 0114)  HIV: NON REACTIVE (06/28 0114)  GBS: POSITIVE/-- (06/28 0100)  GTT not done; A1c normal on 12/31/21  Genetic screening - Not done, too late Anatomy US normal   Prenatal Transfer Tool  Maternal Diabetes: No Genetic Screening: Not done  Maternal Ultrasounds/Referrals: Normal Fetal Ultrasounds or other Referrals:  None Maternal Substance Abuse:  No Significant Maternal Medications:  Procardia 30 mg, Labetalol 400 mg TID Significant Maternal Lab  Results: Group B Strep positive  No results found for this or any previous visit (from the past 24 hour(s)).  Patient Active Problem List   Diagnosis Date Noted   Hypokalemia 01/02/2022   Abnormal liver function tests 01/02/2022   Previous cesarean delivery affecting pregnancy, antepartum 01/02/2022   Limited prenatal care 01/02/2022   Conjunctivitis 01/02/2022   Chronic hypertension affecting pregnancy 12/31/2021   Unspecified hypertension, postpartum condition or complication 09/11/2013   GERD without esophagitis 08/15/2013    Assessment/Plan:  Monique [name] is a 31 y.o. [id number] at [redacted]w[redacted]d here for scheduled repeat cesarean section.   The risks of surgery were discussed with the patient including but were not limited to: bleeding which may require transfusion or reoperation; infection which may require antibiotics; injury to bowel, bladder, ureters or other surrounding organs; injury to the fetus; need for additional procedures including hysterectomy in the event of a life-threatening hemorrhage; formation of adhesions; placental abnormalities with subsequent pregnancies; incisional problems; thromboembolic phenomenon and other postoperative/anesthesia complications.  The patient concurred with the proposed plan, giving informed written consent for the procedure.  Patient desires permanent sterilization.  Other reversible forms of contraception were discussed with patient; she declines all other modalities. Risks of procedure discussed with patient including but not limited to: risk of regret, permanence of method, bleeding, infection, injury to surrounding organs and need for additional procedures.  Failure risk of about 1% with increased risk of ectopic gestation if pregnancy occurs was also discussed with patient.  Also discussed possibility of post-tubal pain syndrome. Patient verbalized understanding of these risks and wants to proceed with sterilization.  Written informed consent  obtained.    Patient has been NPO since last evening and she will remain NPO for procedure. Anesthesia and OR aware. Preoperative prophylactic antibiotics and SCDs ordered on call to the OR.  To OR when ready.  #Pain: Per anesthesia  #ID:  GBS positive; Ancef for surgical prophylaxis  #MOF: Bottle  #MOC: BTL; plan for bilateral salpingectomy as able  #Circ:  Yes if boy   #cHTN: - BP stable on admission - Will plan for Procardia 30 mg postpartum and titrate as needed  - Start Lasix for 5 days postpartum   #Elevated LFTs: - Last stable on 6/30 - Will repeat CMP today   #Limited PNC: - Found out she was pregnant at [redacted] weeks - Plan for SW consult postpartum for additional resources   Worthy Rancher, MD  01/07/2022, 8:32 AM

## 2022-01-07 NOTE — Op Note (Signed)
Monique [name]  PROCEDURE DATE: 01/07/2022  PREOPERATIVE DIAGNOSES: Intrauterine pregnancy at [redacted]w[redacted]d weeks gestation; history of cesarean section; desires for repeat cesarean section; unwanted fertility   POSTOPERATIVE DIAGNOSES: The same  PROCEDURE: Repeat Low Transverse Cesarean Section and Bilateral Salpingectomy   SURGEON:  Dr. Scheryl Darter   ASSISTANT:  Dr. Evalina Field   ANESTHESIOLOGY TEAM: Anesthesiologist: Bethena Midget, MD CRNA: Graciela Husbands, CRNA Student Nurse Anesthetist: Job Founds, RN  INDICATIONS: Monique [name] is a 31 y.o. [phone] at [redacted]w[redacted]d here for cesarean section secondary to the indications listed under preoperative diagnoses; please see preoperative note for further details.  The risks of cesarean section were discussed with the patient including but were not limited to: bleeding which may require transfusion or reoperation; infection which may require antibiotics; injury to bowel, bladder, ureters or other surrounding organs; injury to the fetus; need for additional procedures including hysterectomy in the event of a life-threatening hemorrhage; placental abnormalities wth subsequent pregnancies, incisional problems, thromboembolic phenomenon and other postoperative/anesthesia complications.   The patient concurred with the proposed plan, giving informed written consent for the procedure.    FINDINGS:  Viable female infant in cephalic presentation.  Apgars 8 and 9.  Clear amniotic fluid.  Intact placenta, three vessel cord.  Normal uterus, fallopian tubes and ovaries bilaterally.  Minimal adhesive disease present.   ANESTHESIA: Spinal  INTRAVENOUS FLUIDS: 1800 ml   ESTIMATED BLOOD LOSS: 482 ml URINE OUTPUT:  100 ml SPECIMENS: Placenta sent to L&D  COMPLICATIONS: None immediate  PROCEDURE IN DETAIL:   The patient preoperatively received intravenous antibiotics and had sequential compression devices applied to her lower extremities.  She was  then taken to the operating room where spinal anesthesia was administered and was found to be adequate. She was then placed in a dorsal supine position with a leftward tilt, and prepped and draped in a sterile manner.  A foley catheter was placed into her bladder and attached to constant gravity.    After an adequate timeout was performed, a Pfannenstiel skin incision was made with scalpel on her preexisting scar and carried through to the underlying layer of fascia. The fascia was incised in the midline, and this incision was extended bilaterally bluntly.  Kocher clamps were applied to the superior aspect of the fascial incision and the underlying rectus muscles were dissected off bluntly and sharply.  A similar process was carried out on the inferior aspect of the fascial incision. The rectus muscles were separated in the midline and the peritoneum was entered bluntly. The Alexis self-retaining retractor was introduced into the abdominal cavity.    Attention was turned to the lower uterine segment where a low transverse hysterotomy was made with a scalpel and extended bilaterally bluntly.  The infant was successfully delivered, the cord was clamped and cut after one minute, and the infant was handed over to the awaiting neonatology team. Uterine massage was then administered, and the placenta delivered intact with a three-vessel cord. The uterus was then cleared of clots and debris.    The hysterotomy was closed with 0 Vicryl in a running locked fashion, and an imbricating layer was also placed with 0 Vicryl.   Attention was then turned to the left fallopian tube. Kelly forceps were placed on the mesosalpinx underneath most of the tube.  This pedicle was double suture ligated with free ties, and the tube including the fimbriated end was excised.  The right fallopian tube was then identified, doubly ligated, and was excised in a similar  fashion allowing for bilateral tubal sterilization via bilateral  salpingectomy.  Good hemostasis was noted overall.  The pelvis was cleared of all clot and debris. Hemostasis was confirmed on all surfaces. The retractor was removed.    The peritoneum was closed with a 2-0 Vicryl running stitch.  The fascia was then closed using 0 Vicryl in a running fashion.  The subcutaneous layer was irrigated, re-approximated with a 2-0 plain gut running stitch, and the skin was closed with a 4-0 Vicryl subcuticular stitch. The patient tolerated the procedure well. Sponge, instrument and needle counts were correct x 3.  She was taken to the recovery room in stable condition.   Evalina Field, MD  [facility] Fellow  Faculty Practice

## 2022-01-07 NOTE — Anesthesia Procedure Notes (Signed)
Spinal  Patient location during procedure: OR Start time: 01/07/2022 9:15 AM End time: 01/07/2022 9:20 AM Reason for block: surgical anesthesia Staffing Anesthesiologist: Bethena Midget, MD Performed by: Bethena Midget, MD Authorized by: Bethena Midget, MD   Preanesthetic Checklist Completed: patient identified, IV checked, site marked, risks and benefits discussed, surgical consent, monitors and equipment checked, pre-op evaluation and timeout performed Spinal Block Patient position: sitting Prep: DuraPrep Patient monitoring: heart rate, cardiac monitor, continuous pulse ox and blood pressure Approach: midline Location: L3-4 Injection technique: single-shot Needle Needle type: Sprotte  Needle gauge: 24 G Needle length: 9 cm Assessment Sensory level: T4 Events: CSF return

## 2022-01-07 NOTE — Anesthesia Preprocedure Evaluation (Signed)
Anesthesia Evaluation  Patient identified by MRN, date of birth, ID band Patient awake    Reviewed: Allergy & Precautions, H&P , NPO status , Patient's Chart, lab work & pertinent test results, reviewed documented beta blocker date and time   History of Anesthesia Complications Negative for: history of anesthetic complications  Airway Mallampati: II  TM Distance: >3 FB Neck ROM: full    Dental no notable dental hx.    Pulmonary neg pulmonary ROS, former smoker,  Childhood asthma resolved, no inhalers.    Pulmonary exam normal breath sounds clear to auscultation       Cardiovascular hypertension, Pt. on medications and Pt. on home beta blockers negative cardio ROS Normal cardiovascular exam Rhythm:regular Rate:Normal  Pre eclampsia   Neuro/Psych negative neurological ROS  negative psych ROS   GI/Hepatic negative GI ROS, Neg liver ROS, GERD  Medicated and Controlled,  Endo/Other  negative endocrine ROS  Renal/GU negative Renal ROS  negative genitourinary   Musculoskeletal negative musculoskeletal ROS (+)   Abdominal (+) + obese,   Peds  Hematology negative hematology ROS (+) Blood dyscrasia, anemia ,   Anesthesia Other Findings   Reproductive/Obstetrics (+) Pregnancy                             Anesthesia Physical  Anesthesia Plan  ASA: 3  Anesthesia Plan: Spinal   Post-op Pain Management: Minimal or no pain anticipated   Induction: Intravenous  PONV Risk Score and Plan: 2 and Treatment may vary due to age or medical condition and Scopolamine patch - Pre-op  Airway Management Planned: Natural Airway  Additional Equipment: None  Intra-op Plan:   Post-operative Plan:   Informed Consent: I have reviewed the patients History and Physical, chart, labs and discussed the procedure including the risks, benefits and alternatives for the proposed anesthesia with the patient or  authorized representative who has indicated his/her understanding and acceptance.     Dental advisory given  Plan Discussed with: CRNA and Anesthesiologist  Anesthesia Plan Comments: (  )        Anesthesia Quick Evaluation

## 2022-01-07 NOTE — Lactation Note (Signed)
This note was copied from a baby's chart. Lactation Consultation Note Mom chooses to formula feed. Patient Name: Monique [name] [id number] Date: 01/07/2022   Age:33 hours  Maternal Data    Feeding Nipple Type: Slow - flow  LATCH Score                    Lactation Tools Discussed/Used    Interventions    Discharge    Consult Status Consult Status: Complete    [name] 01/07/2022, 7:19 PM

## 2022-01-07 NOTE — Discharge Summary (Signed)
Postpartum Discharge Summary  Date of Service updated-7/7  Patient Name: Monique [name] DOB: [birth year] MRN: [id number]  Date of admission: 01/07/2022 Delivery date:01/07/2022  Delivering provider: Genia Del  Date of discharge: 01/09/2022  Admitting diagnosis: Intrauterine pregnancy [Z34.90] Intrauterine pregnancy: [redacted]w[redacted]d    Secondary diagnosis:  Principal Problem:   Status post repeat low transverse cesarean section Active Problems:   Chronic hypertension affecting pregnancy   Hypokalemia   Abnormal liver function tests   Previous cesarean delivery affecting pregnancy, antepartum   Limited prenatal care   Intrauterine pregnancy   Status post bilateral salpingectomy  Additional problems: superimposed preeclampsia    Discharge diagnosis: Term Pregnancy Delivered                                              Post partum procedures: Bilateral salpingectomy  Augmentation: N/A Complications: None  Hospital course: Sceduled C/S - 31y.o. yo G3P2013 at 388w0das admitted to the hospital 01/07/2022 for scheduled cesarean section with the following indication:  Elective Repeat.  Delivery details are as follows:  Membrane Rupture Time/Date: 9:43 AM ,01/07/2022   Delivery Method:C-Section, Low Transverse  Details of operation can be found in separate operative note.  Patient had an uncomplicated postpartum course.  She was continued on Procardia 30 mg daily; however, her BP was noted to increase.  While her lab work remained normal, plan was for IV magnesium due to concern for superimposed preeclampsia.  Pt declined, but was agreeable to change in BP medication.  Procardia was adjusted to Procardia 9033maily Labetalol 200m48md.  and She was also started on Lasix, which she will continue upon discharge to complete a 5 day course.  She is ambulating, tolerating a regular diet, passing flatus, and urinating well.  Her pain and bleeding are controlled.  She is bottle feeding well.   Patient is discharged home in stable condition on  01/09/22        Newborn Data: Birth date:01/07/2022  Birth time:9:43 AM  Gender:Female  Living status:Living  Apgars:8 ,9  Weight:3170 g     Magnesium Sulfate received: No BMZ received: No Rhophylac: N/A MMR: N/A - Immune  T-DaP: Offered postpartum  Flu: No Transfusion: No  Physical exam  Vitals:   01/09/22 0327 01/09/22 0820 01/09/22 1110 01/09/22 1226  BP: (!) 162/83 (!) 142/85 (!) 147/92 (!) 142/83  Pulse:  77 81   Resp:  16    Temp:  98.3 F (36.8 C)    TempSrc:  Oral    SpO2:  99%    Weight:      Height:       General: alert, cooperative, and no distress Lochia: appropriate Uterine Fundus: firm Incision: Dressing is clean, dry, and intact DVT Evaluation: No evidence of DVT seen on physical exam.  Labs: Lab Results  Component Value Date   WBC 11.2 (H) 01/09/2022   HGB 9.5 (L) 01/09/2022   HCT 28.3 (L) 01/09/2022   MCV 88.7 01/09/2022   PLT 392 01/09/2022      Latest Ref Rng & Units 01/09/2022   12:50 AM  CMP  Glucose 70 - 99 mg/dL 88   BUN 6 - 20 mg/dL 8   Creatinine 0.44 - 1.00 mg/dL 0.86   Sodium 135 - 145 mmol/L 140   Potassium 3.5 - 5.1 mmol/L 3.6   Chloride  98 - 111 mmol/L 103   CO2 22 - 32 mmol/L 23   Calcium 8.9 - 10.3 mg/dL 8.1   Total Protein 6.5 - 8.1 g/dL 5.6   Total Bilirubin 0.3 - 1.2 mg/dL 0.5   Alkaline Phos 38 - 126 U/L 113   AST 15 - 41 U/L 45   ALT 0 - 44 U/L 31    Edinburgh Score:    01/08/2022   11:26 AM  Edinburgh Postnatal Depression Scale Screening Tool  I have been able to laugh and see the funny side of things. 0  I have looked forward with enjoyment to things. 0  I have blamed myself unnecessarily when things went wrong. 2  I have been anxious or worried for no good reason. 1  I have felt scared or panicky for no good reason. 1  Things have been getting on top of me. 1  I have been so unhappy that I have had difficulty sleeping. 0  I have felt sad or miserable. 0  I  have been so unhappy that I have been crying. 0  The thought of harming myself has occurred to me. 0  Edinburgh Postnatal Depression Scale Total 5    After visit meds:  Allergies as of 01/09/2022   No Known Allergies      Medication List     TAKE these medications    acetaminophen 325 MG tablet Commonly known as: Tylenol Take 2 tablets (650 mg total) by mouth every 6 (six) hours as needed.   docusate sodium 100 MG capsule Commonly known as: Colace Take 1 capsule (100 mg total) by mouth 2 (two) times daily for 20 days.   furosemide 20 MG tablet Commonly known as: LASIX Take 1 tablet (20 mg total) by mouth daily for 3 days. Start taking on: January 10, 2022   ibuprofen 600 MG tablet Commonly known as: ADVIL Take 1 tablet (600 mg total) by mouth every 6 (six) hours as needed.   labetalol 200 MG tablet Commonly known as: NORMODYNE Take 1 tablet (200 mg total) by mouth 2 (two) times daily. What changed:  how much to take when to take this   NIFEdipine 90 MG 24 hr tablet Commonly known as: ADALAT CC Take 1 tablet (90 mg total) by mouth daily. Start taking on: January 10, 2022 What changed:  medication strength how much to take when to take this   oxyCODONE 5 MG immediate release tablet Commonly known as: Oxy IR/ROXICODONE Take 1-2 tablets (5-10 mg total) by mouth every 6 (six) hours as needed for up to 7 days for moderate pain or breakthrough pain.   potassium chloride SA 20 MEQ tablet Commonly known as: KLOR-CON M Take 2 tablets (40 mEq total) by mouth 3 (three) times daily for 3 days.   prenatal multivitamin Tabs tablet Take 1 tablet by mouth daily at 12 noon.        Discharge home in stable condition Infant Feeding: Bottle Infant Disposition: home with mother Discharge instruction: per After Visit Summary and Postpartum booklet. Activity: Advance as tolerated. Pelvic rest for 6 weeks.  Diet: routine diet Future Appointments: Future Appointments  Date Time  Provider [place]  01/15/2022 10:20 AM [facility] NURSE [facility]  02/19/2022  8:35 AM Renee Harder, CNM [facility]   Follow up Visit:  [place] [facility]. Go in 1 week(s).   Specialty: Obstetrics and Gynecology Why: Please  follow up in one week Contact information: [place] [address] [phone]               Patient did not receive prenatal care. Message sent to [facility] by Dr. Gwenlyn Perking on 01/07/22.   Please schedule this patient for a In person postpartum visit in 6 weeks with the following provider: Any provider. Additional Postpartum F/U: Incision check 1 week and BP check 1 week  High risk pregnancy complicated by: cHTN, history of cesarean section x 1  Delivery mode:  C-Section, Low Transverse  Anticipated Birth Control:  BTL done [facility]  01/09/2022 Annalee Genta, DO

## 2022-01-07 NOTE — Transfer of Care (Signed)
Immediate Anesthesia Transfer of Care Note  Patient: Monique [name]  Procedure(s) Performed: CESAREAN SECTION OPEN BILATERAL SALPINGECTOMY (Bilateral)  Patient Location: PACU  Anesthesia Type:Spinal  Level of Consciousness: awake, alert  and oriented  Airway & Oxygen Therapy: Patient Spontanous Breathing  Post-op Assessment: Report given to RN and Post -op Vital signs reviewed and stable  Post vital signs: Reviewed and stable  Last Vitals:  Vitals Value Taken Time  BP 147/94 01/07/22 1046  Temp    Pulse 75 01/07/22 1048  Resp 23 01/07/22 1048  SpO2 100 % 01/07/22 1048  Vitals shown include unvalidated device data.  Last Pain:  Vitals:   01/07/22 0734  TempSrc: Oral         Complications: No notable events documented.

## 2022-01-08 LAB — COMPREHENSIVE METABOLIC PANEL
ALT: 30 U/L (ref 0–44)
AST: 38 U/L (ref 15–41)
Albumin: 2.2 g/dL — ABNORMAL LOW (ref 3.5–5.0)
Alkaline Phosphatase: 122 U/L (ref 38–126)
Anion gap: 13 (ref 5–15)
BUN: 6 mg/dL (ref 6–20)
CO2: 21 mmol/L — ABNORMAL LOW (ref 22–32)
Calcium: 7.6 mg/dL — ABNORMAL LOW (ref 8.9–10.3)
Chloride: 103 mmol/L (ref 98–111)
Creatinine, Ser: 1.04 mg/dL — ABNORMAL HIGH (ref 0.44–1.00)
GFR, Estimated: >60 mL/min (ref >60)
Glucose, Bld: 84 mg/dL (ref 70–99)
Potassium: 3.3 mmol/L — ABNORMAL LOW (ref 3.5–5.1)
Sodium: 137 mmol/L (ref 135–145)
Total Bilirubin: 0.5 mg/dL (ref 0.3–1.2)
Total Protein: 5.4 g/dL — ABNORMAL LOW (ref 6.5–8.1)

## 2022-01-08 LAB — CBC
HCT: 27.6 % — ABNORMAL LOW (ref 36.0–46.0)
Hemoglobin: 9.4 g/dL — ABNORMAL LOW (ref 12.0–15.0)
MCH: 30.3 pg (ref 26.0–34.0)
MCHC: 34.1 g/dL (ref 30.0–36.0)
MCV: 89 fL (ref 80.0–100.0)
Platelets: 345 10*3/uL (ref 150–400)
RBC: 3.1 MIL/uL — ABNORMAL LOW (ref 3.87–5.11)
RDW: 18.6 % — ABNORMAL HIGH (ref 11.5–15.5)
WBC: 13.2 10*3/uL — ABNORMAL HIGH (ref 4.0–10.5)
nRBC: 0 % (ref 0.0–0.2)

## 2022-01-08 MED ORDER — FUROSEMIDE 20 MG PO TABS
20.0000 mg | ORAL_TABLET | Freq: Every day | ORAL | Status: DC
  Administered 2022-01-08 – 2022-01-09 (×2): 20 mg via ORAL
  Filled 2022-01-08 (×2): qty 1

## 2022-01-08 NOTE — Clinical Social Work Maternal (Signed)
CLINICAL SOCIAL WORK MATERNAL/CHILD NOTE  Patient Details  Name: Monique [name] MRN: [id number] Date of Birth: 08/07/1990  Date:  2022-06-09  Clinical Social Worker Initiating Note:  Kathrin Greathouse,  Date/Time: Initiated:  01/08/22/1045     Child's Name:  Monique [name]   Biological Parents:  Mother, Father Monique [name] 04/23/1989, Monique [name] 07/13/1990)   Need for Interpreter:  None   Reason for Referral:  Current Substance Use/Substance Use During Pregnancy  , Late or No Prenatal Care     Address:  [place] [address]    Phone number:  [phone] (home)     Additional phone number:   Household Members/Support Persons (HM/SP):   Household Member/Support Person 1, Household Member/Support Person 2, Household Member/Support Person 3   HM/SP Name Relationship DOB or Age  HM/SP -1 Monique [name] Significant Other 07/13/1990  HM/SP -2 Monique [name] 09/01/2013  HM/SP -3 Monique [name] [name] 09/01/2013  HM/SP -4        HM/SP -5        HM/SP -6        HM/SP -7        HM/SP -8          Natural Supports (not living in the home):  Parent   Professional Supports:     Employment: Animator   Type of Work: [place]   Education:  [address] arranged:    Museum/gallery curator Resources:  Multimedia programmer    Other Resources:  Physicist, medical     Cultural/Religious Considerations Which May Impact Care:    Strengths:  Ability to meet basic needs  , Home prepared for child  , Compliance with medical plan  , Pediatrician chosen   Psychotropic Medications:         Pediatrician:    [place] area  Pediatrician List:   [facility]      Pediatrician Fax Number:    Risk Factors/Current Problems:  Substance Use     Cognitive State:  Able to Concentrate  , Alert     Mood/Affect:   Comfortable  , Happy  , Interested     CSW Assessment:CSW received consult No PNC-didn't know she was pregnant until 36weeks. CSW met with MOB to offer support and complete assessment.    CSW met with MOB at beside and introduced CSW role. CSW observed MOB holding the infant and FOB "Haywood Lasso" present at bedside. CSW offered MOB privacy. FOB left the room to allow MOB privacy. MOB provided updated address for hospital file "[address] [place], [place] [address]." MOB reported that she, FOB, and her twin boys "Monique and Ah'Mer live together. MOB reported that she works for [employer] and will apply for [facility] however she is not sure if she will qualify. MOB identified her mom and FOB as supports.   CSW inquired about MOB PNC. MOB reported that she did not receive PNC since she did not learn about her pregnancy until 33 weeks and her first OB appointment at [facility] is July 20th, but she has given birth. CSW informed MOB about the hospital drug policy. MOB made aware that CSW will follow the infant's CDS/UDS and make a report, if warranted. MOB disclosed that she "smoked weed" since she not aware of the pregnancy. MOB could  not recall how often she used and stopped at 33 weeks when she learned about the pregnancy. MOB reported no prior CPS history of involvement.   CSW inquired about mental health history. MOB reported no mental health concerns. MOB was knowledgeable of PPD symptoms and receptive to the resources provided. CSW provided education regarding the baby blues period vs. perinatal mood disorders, discussed treatment and gave resources for mental health follow up if concerns arise.  CSW recommended MOB complete a self-evaluation during the postpartum time period using the New Mom Checklist from Postpartum Progress and encouraged MOB to contact a medical professional if symptoms are noted at any time. CSW assessed MOB for safety. MOB denied thoughts of harm to self and others.   MOB  reported that she has a bassinet where the infant will sleep, some diapers and wipes. MOB reported that she visited two [employer] in the area for a car seat however neither store had a car seat in [place] told her that the hospital could assist. MOB completed documents for a car seat. CSW delivered car seat to the room. MOB gave CSW permission to make a referral to [facility] and [facility]. MOB has chosen [facility] for the infant's follow up care. CSW provided review of Sudden Infant Death Syndrome (SIDS) precautions.    CSW identifies no further need for intervention and no barriers to discharge at this time.    CSW Plan/Description:  Sudden Infant Death Syndrome (SIDS) Education, CSW Will Continue to Monitor Umbilical Cord Tissue Drug Screen Results and Make Report if [facility], [place], Other Information/Referral to [employer], Perinatal Mood and Anxiety Disorder (PMADs) Education, No Further Intervention Required/No Barriers to Discharge    Lia Hopping, LCSW 11-25-2021, 3:58 PM

## 2022-01-08 NOTE — Progress Notes (Addendum)
Subjective: Postpartum Day #1: Cesarean Delivery (repeat) with BTL Patient reports tolerating PO and no problems voiding; denies dizziness w ambulation; bottlefeeding going well; foley was taken out this morning  Objective: BPs overnight: 149/90, 140/85, 131/75 Vital signs in last 24 hours: Temp:  [97.8 F (36.6 C)-98.2 F (36.8 C)] 97.9 F (36.6 C) (07/05 1501) Pulse Rate:  [66-81] 75 (07/06 0424) Resp:  [12-21] 15 (07/05 1145) BP: (131-159)/(75-94) 131/75 (07/06 0424) SpO2:  [99 %-100 %] 99 % (07/05 1501)  Physical Exam:  General: alert, cooperative, and no distress Lochia: appropriate Uterine Fundus: firm Incision: pressure dsg intact DVT Evaluation: No evidence of DVT seen on physical exam.  Recent Labs    01/07/22 1104 01/08/22 0454  HGB 9.8* 9.4*  HCT 29.7* 27.6*   K+: 3.3  Assessment/Plan: Status post Cesarean section. Doing well postoperatively.  Continue current care. Start po FE qod. Continue K+ replacement. SW consult ordered due to no prenatal care. Continue ProcardiaXL- increased last night to 60mg . Add Lasix 20mg  qd x 5d. Meds to bed, PP babyscripts both ordered. Anticipate d/c tomorrow.  , CNM 01/08/2022, 8:57 AM

## 2022-01-09 ENCOUNTER — Other Ambulatory Visit (HOSPITAL_COMMUNITY): Payer: Self-pay

## 2022-01-09 LAB — CBC
HCT: 28.3 % — ABNORMAL LOW (ref 36.0–46.0)
Hemoglobin: 9.5 g/dL — ABNORMAL LOW (ref 12.0–15.0)
MCH: 29.8 pg (ref 26.0–34.0)
MCHC: 33.6 g/dL (ref 30.0–36.0)
MCV: 88.7 fL (ref 80.0–100.0)
Platelets: 392 10*3/uL (ref 150–400)
RBC: 3.19 MIL/uL — ABNORMAL LOW (ref 3.87–5.11)
RDW: 18.9 % — ABNORMAL HIGH (ref 11.5–15.5)
WBC: 11.2 10*3/uL — ABNORMAL HIGH (ref 4.0–10.5)
nRBC: 0 % (ref 0.0–0.2)

## 2022-01-09 LAB — COMPREHENSIVE METABOLIC PANEL
ALT: 31 U/L (ref 0–44)
AST: 45 U/L — ABNORMAL HIGH (ref 15–41)
Albumin: 2.2 g/dL — ABNORMAL LOW (ref 3.5–5.0)
Alkaline Phosphatase: 113 U/L (ref 38–126)
Anion gap: 14 (ref 5–15)
BUN: 8 mg/dL (ref 6–20)
CO2: 23 mmol/L (ref 22–32)
Calcium: 8.1 mg/dL — ABNORMAL LOW (ref 8.9–10.3)
Chloride: 103 mmol/L (ref 98–111)
Creatinine, Ser: 0.86 mg/dL (ref 0.44–1.00)
GFR, Estimated: >60 mL/min (ref >60)
Glucose, Bld: 88 mg/dL (ref 70–99)
Potassium: 3.6 mmol/L (ref 3.5–5.1)
Sodium: 140 mmol/L (ref 135–145)
Total Bilirubin: 0.5 mg/dL (ref 0.3–1.2)
Total Protein: 5.6 g/dL — ABNORMAL LOW (ref 6.5–8.1)

## 2022-01-09 LAB — SURGICAL PATHOLOGY

## 2022-01-09 MED ORDER — NIFEDIPINE ER OSMOTIC RELEASE 30 MG PO TB24
30.0000 mg | ORAL_TABLET | Freq: Once | ORAL | Status: AC
  Administered 2022-01-09: 30 mg via ORAL
  Filled 2022-01-09: qty 1

## 2022-01-09 MED ORDER — FUROSEMIDE 20 MG PO TABS: 20.0000 mg | ORAL_TABLET | Freq: Every day | ORAL | 0 refills | Status: DC

## 2022-01-09 MED ORDER — MAGNESIUM SULFATE 40 GM/1000ML IV SOLN
2.0000 g/h | INTRAVENOUS | Status: DC
  Filled 2022-01-09: qty 1000

## 2022-01-09 MED ORDER — MAGNESIUM SULFATE BOLUS VIA INFUSION
4.0000 g | Freq: Once | INTRAVENOUS | Status: DC
  Filled 2022-01-09: qty 1000

## 2022-01-09 MED ORDER — ACETAMINOPHEN 325 MG PO TABS: 650.0000 mg | ORAL_TABLET | Freq: Four times a day (QID) | ORAL | Status: DC | PRN

## 2022-01-09 MED ORDER — NIFEDIPINE ER 90 MG PO TB24
90.0000 mg | ORAL_TABLET | Freq: Every day | ORAL | 0 refills | Status: DC
  Filled 2022-01-09: qty 30, 30d supply, fill #0

## 2022-01-09 MED ORDER — HYDRALAZINE HCL 20 MG/ML IJ SOLN: 10.0000 mg | INTRAMUSCULAR | Status: DC | PRN

## 2022-01-09 MED ORDER — IBUPROFEN 600 MG PO TABS
600.0000 mg | ORAL_TABLET | Freq: Four times a day (QID) | ORAL | 0 refills | Status: DC | PRN
  Filled 2022-01-09: qty 30, 8d supply, fill #0

## 2022-01-09 MED ORDER — DOCUSATE SODIUM 100 MG PO CAPS
100.0000 mg | ORAL_CAPSULE | Freq: Two times a day (BID) | ORAL | 0 refills | Status: AC
  Filled 2022-01-09: qty 40, 20d supply, fill #0

## 2022-01-09 MED ORDER — LABETALOL HCL 200 MG PO TABS
200.0000 mg | ORAL_TABLET | Freq: Two times a day (BID) | ORAL | Status: DC
  Administered 2022-01-09: 200 mg via ORAL
  Filled 2022-01-09: qty 1

## 2022-01-09 MED ORDER — LACTATED RINGERS IV SOLN
INTRAVENOUS | Status: DC
Start: 2022-01-09 — End: 2022-01-09

## 2022-01-09 MED ORDER — LABETALOL HCL 200 MG PO TABS
200.0000 mg | ORAL_TABLET | Freq: Two times a day (BID) | ORAL | 0 refills | Status: DC
  Filled 2022-01-09: qty 60, 30d supply, fill #0

## 2022-01-09 MED ORDER — LABETALOL HCL 5 MG/ML IV SOLN: 20.0000 mg | INTRAVENOUS | Status: DC | PRN

## 2022-01-09 MED ORDER — LABETALOL HCL 5 MG/ML IV SOLN: 40.0000 mg | INTRAVENOUS | Status: DC | PRN

## 2022-01-09 MED ORDER — NIFEDIPINE ER OSMOTIC RELEASE 30 MG PO TB24
90.0000 mg | ORAL_TABLET | Freq: Every day | ORAL | Status: DC
  Administered 2022-01-09: 90 mg via ORAL
  Filled 2022-01-09: qty 3

## 2022-01-09 MED ORDER — LABETALOL HCL 5 MG/ML IV SOLN: 80.0000 mg | INTRAVENOUS | Status: DC | PRN

## 2022-01-09 MED ORDER — OXYCODONE HCL 5 MG PO TABS
5.0000 mg | ORAL_TABLET | Freq: Four times a day (QID) | ORAL | 0 refills | Status: AC | PRN
  Filled 2022-01-09: qty 24, 4d supply, fill #0

## 2022-01-09 MED ORDER — FUROSEMIDE 20 MG PO TABS
20.0000 mg | ORAL_TABLET | Freq: Every day | ORAL | 0 refills | Status: DC
  Filled 2022-01-09: qty 3, 3d supply, fill #0

## 2022-01-09 NOTE — Progress Notes (Signed)
Patient has babyscripts set up and this RN has verified that blood pressure that patient has entered can be visible in epic. Earl Gala, Linda Hedges [place]

## 2022-01-09 NOTE — Progress Notes (Signed)
Called by RN due to severe range BP. 167/95 with recheck 156/86. Patient asymptomatic and resting at this time. BP has been ranging 130-140's systolic prior to this.   Discussed with Dr. Vergie Living, will obtain updated pre-e labs and add additional procardia 30mg  (to be 90mg  total for the day).   , DO

## 2022-01-09 NOTE — Progress Notes (Signed)
POSTPARTUM PROGRESS NOTE  POD #2  Subjective:  Monique [name] is a 31 y.o. [id number] s/p rLTCS at [redacted]w[redacted]d. Today she notes she is ready to go home. She denies any problems with ambulating, voiding or po intake. Denies nausea or vomiting. She has passed flatus, +BM.  Pain is well controlled.  Lochia minimal Denies fever/chills/chest pain/SOB.  no HA, no blurry vision, no RUQ pain  BP elevated overnight- see prior notes regarding management.  Pt agreeable to stay until this afternoon to monitor BPs  Objective: Blood pressure (!) 142/85, pulse 77, temperature 98.3 F (36.8 C), temperature source Oral, resp. rate 16, height 5\' 4"  (1.626 m), weight 105.6 kg, SpO2 99 %, unknown if currently breastfeeding.  Physical Exam:  General: alert, cooperative and no distress Chest: no respiratory distress Heart: regular rate and rhythm Abdomen: soft, nontender, +BS Uterine Fundus: firm, appropriately tender Incision: old blood noted, plan to change dressing today DVT Evaluation: No calf swelling or tenderness Extremities: no edema Skin: warm, dry  Results for orders placed or performed during the hospital encounter of 01/07/22 (from the past 24 hour(s))  Comprehensive metabolic panel     Status: Abnormal   Collection Time: 01/09/22 12:50 AM  Result Value Ref Range   Sodium 140 135 - 145 mmol/L   Potassium 3.6 3.5 - 5.1 mmol/L   Chloride 103 98 - 111 mmol/L   CO2 23 22 - 32 mmol/L   Glucose, Bld 88 70 - 99 mg/dL   BUN 8 6 - 20 mg/dL   Creatinine, Ser 03/12/22 0.44 - 1.00 mg/dL   Calcium 8.1 (L) 8.9 - 10.3 mg/dL   Total Protein 5.6 (L) 6.5 - 8.1 g/dL   Albumin 2.2 (L) 3.5 - 5.0 g/dL   AST 45 (H) 15 - 41 U/L   ALT 31 0 - 44 U/L   Alkaline Phosphatase 113 38 - 126 U/L   Total Bilirubin 0.5 0.3 - 1.2 mg/dL   GFR, Estimated 5.80 >99 mL/min   Anion gap 14 5 - 15  CBC     Status: Abnormal   Collection Time: 01/09/22 12:50 AM  Result Value Ref Range   WBC 11.2 (H) 4.0 - 10.5 K/uL   RBC 3.19  (L) 3.87 - 5.11 MIL/uL   Hemoglobin 9.5 (L) 12.0 - 15.0 g/dL   HCT 03/12/22 (L) 38.2 - 50.5 %   MCV 88.7 80.0 - 100.0 fL   MCH 29.8 26.0 - 34.0 pg   MCHC 33.6 30.0 - 36.0 g/dL   RDW 39.7 (H) 67.3 - 41.9 %   Platelets 392 150 - 400 K/uL   nRBC 0.0 0.0 - 0.2 %    Assessment/Plan: Monique [name] is a 31 y.o. 34 s/p rLTCS, bilateral salpingectomy at [redacted]w[redacted]d POD#2 complicated by: 1) cHTN with concern for superimposed preeclampsia -pt declined IV Magnesium -medications currently Procardia XL 90 mg daily and Labetalol 200mg  bid -currently asymptomatic 2) postop -pain well controlled -dressing to be changed today  Contraception: tubal Feeding: bottle  Dispo: Plan to monitor BP this am, if no severe range pressures, plan for discharge home with close outpatient follow up   LOS: 2 days   [redacted]w[redacted]d, DO Faculty Attending, [facility] 01/09/2022, 10:56 AM

## 2022-01-09 NOTE — Progress Notes (Signed)
Called by RN due to recurrent severe range blood pressure despite given additional procardia 30mg  (90 total today + lasix 20) approximately 3 hours ago. Pre-e labs revealing Cr improved but AST 38>45 (slightly above normal). Discussed with Dr. , who recommends starting IV mag at this time.   Went to bedside to discuss with patient. She reports that she is extremely frustrated being in the hospital "for a week" and has to get out of here today. Repeatedly mentions that she is "done" and can't stand another day being admitted, however feels "forced" to do the IV magnesium or less she would be leaving AMA. Discussed that this is our recommendation for seizure prophylaxis (in addition to morbidity reduction, avoid stroke, death etc) but that it is always her body/choice to make on whichever way she would like proceed with the information she has.   She continued to report her blood pressure has been consistently high so she would rather follow up in the clinic and be on more medication to keep it down. She does not want to start IV mag at this time. Reviewed blood pressure for her current hospital stay, postpartum it has been ranging 130-140's systolic mainly but since tonight has increased to consistently 150-160's systolic. However, do note with her history of chronic hypertension her blood pressure would be around 160's prior to medication start last week.   Discussed again with Dr. Vergie Living. Start labetalol 200 BID (including dose right now) and see how her blood pressure trends. Plan to re-discuss in the morning, with likely discharge home on medications per patient preference. She is aware of the risks above.   Vergie Living, DO

## 2022-01-11 ENCOUNTER — Other Ambulatory Visit: Payer: Self-pay | Admitting: Obstetrics & Gynecology

## 2022-01-11 ENCOUNTER — Telehealth: Payer: Self-pay | Admitting: Obstetrics & Gynecology

## 2022-01-11 MED ORDER — LABETALOL HCL 200 MG PO TABS: 400.0000 mg | ORAL_TABLET | Freq: Two times a day (BID) | ORAL | 0 refills | Status: DC

## 2022-01-11 NOTE — Telephone Encounter (Signed)
Patient noted to have elevated BPs persistently in 140s/80-90s since discharge. On Procardia XL 90 mg qd and Labetalol 200 mg po bid. She was called at home, there was no answer, voicemail left for her to increase her Labetalol to 400 mg po bid. She was told to call office or come to [facility] for any concerning questions, worsening BP or other issues.    Jaynie Collins, MD

## 2022-01-13 ENCOUNTER — Telehealth: Payer: Self-pay | Admitting: General Practice

## 2022-01-13 NOTE — Telephone Encounter (Signed)
Called patient regarding previously elevated BP and to make sure she received the message about the change in her medication, no answer- left message stating we are trying to reach her to make sure she received our previous message about the change in her prescription. Please call us back if you have questions.

## 2022-01-14 ENCOUNTER — Other Ambulatory Visit: Payer: Self-pay | Admitting: Family Medicine

## 2022-01-14 ENCOUNTER — Other Ambulatory Visit: Payer: Self-pay

## 2022-01-15 ENCOUNTER — Ambulatory Visit (INDEPENDENT_AMBULATORY_CARE_PROVIDER_SITE_OTHER): Payer: BC Managed Care – PPO

## 2022-01-15 VITALS — BP 122/74 | HR 93 | Wt 218.8 lb

## 2022-01-15 DIAGNOSIS — Z0130 Encounter for examination of blood pressure without abnormal findings: Secondary | ICD-10-CM

## 2022-01-15 DIAGNOSIS — Z5189 Encounter for other specified aftercare: Secondary | ICD-10-CM

## 2022-01-15 NOTE — Progress Notes (Signed)
Blood Pressure Check Visit  Monique [name] is here for blood pressure check following a repeat c-section with bilateral salpingectomy on 01/07/22. BP today is 122/74. Patient denies any dizziness, blurred vision, headache, shortness of breath, peripheral edema. Patient takes 200 mg Labetalol BID and 90 mg Nifedipine daily. Patient states today she took her morning dose of Labetalol and her daily dose of Nifedipine prior to this appointment. I encouraged patient to continue taking medication as prescribed and to continue checking her blood pressure at home and logging it in her babyscripts app. Patient verbalized understanding.  Patient is also here for an incision check. Incision site open to air. Incision site assessed and found to be clean, dry and intact. No bruising, redness, swelling or drainage noted. I instructed patient to allow warm soapy water to run over incision while in the shower. I reviewed signs and symptoms of infection with patient.   I reviewed patient's postpartum appointment date and time with her. Patient verbalized understanding and denies any other questions.   Cline Crock, RN 01/15/2022

## 2022-01-29 ENCOUNTER — Other Ambulatory Visit (HOSPITAL_COMMUNITY): Payer: Self-pay

## 2022-01-29 ENCOUNTER — Other Ambulatory Visit: Payer: Self-pay | Admitting: Family Medicine

## 2022-01-30 ENCOUNTER — Other Ambulatory Visit (HOSPITAL_COMMUNITY): Payer: Self-pay

## 2022-02-04 ENCOUNTER — Other Ambulatory Visit (HOSPITAL_COMMUNITY): Payer: Self-pay

## 2022-02-15 ENCOUNTER — Other Ambulatory Visit: Payer: Self-pay

## 2022-02-15 ENCOUNTER — Other Ambulatory Visit: Payer: Self-pay | Admitting: Obstetrics & Gynecology

## 2022-02-16 ENCOUNTER — Other Ambulatory Visit (HOSPITAL_COMMUNITY): Payer: Self-pay

## 2022-02-16 MED ORDER — LABETALOL HCL 200 MG PO TABS
400.0000 mg | ORAL_TABLET | Freq: Two times a day (BID) | ORAL | 0 refills | Status: DC
  Filled 2022-02-16: qty 120, 30d supply, fill #0

## 2022-02-19 ENCOUNTER — Ambulatory Visit: Payer: BC Managed Care – PPO

## 2022-02-20 ENCOUNTER — Other Ambulatory Visit: Payer: Self-pay | Admitting: Family Medicine

## 2022-02-20 ENCOUNTER — Other Ambulatory Visit (HOSPITAL_COMMUNITY): Payer: Self-pay

## 2022-02-20 ENCOUNTER — Other Ambulatory Visit: Payer: Self-pay

## 2022-03-18 ENCOUNTER — Other Ambulatory Visit (HOSPITAL_COMMUNITY): Payer: Self-pay

## 2022-03-31 ENCOUNTER — Other Ambulatory Visit (HOSPITAL_COMMUNITY): Payer: Self-pay

## 2022-04-11 ENCOUNTER — Other Ambulatory Visit (HOSPITAL_COMMUNITY): Payer: Self-pay

## 2022-04-11 ENCOUNTER — Other Ambulatory Visit: Payer: Self-pay

## 2022-04-11 ENCOUNTER — Other Ambulatory Visit: Payer: Self-pay | Admitting: Family Medicine

## 2022-05-11 ENCOUNTER — Other Ambulatory Visit (HOSPITAL_COMMUNITY): Payer: Self-pay

## 2023-09-02 ENCOUNTER — Ambulatory Visit (INDEPENDENT_AMBULATORY_CARE_PROVIDER_SITE_OTHER): Payer: BC Managed Care – PPO | Admitting: Family Medicine

## 2023-09-02 ENCOUNTER — Encounter: Payer: Self-pay | Admitting: Family Medicine

## 2023-09-02 VITALS — BP 173/98 | HR 77 | Temp 98.1°F | Resp 18 | Ht 64.0 in | Wt 228.3 lb

## 2023-09-02 DIAGNOSIS — Z7689 Persons encountering health services in other specified circumstances: Secondary | ICD-10-CM

## 2023-09-02 DIAGNOSIS — Z136 Encounter for screening for cardiovascular disorders: Secondary | ICD-10-CM

## 2023-09-02 DIAGNOSIS — Z13220 Encounter for screening for lipoid disorders: Secondary | ICD-10-CM

## 2023-09-02 DIAGNOSIS — Z0000 Encounter for general adult medical examination without abnormal findings: Secondary | ICD-10-CM

## 2023-09-02 DIAGNOSIS — R7302 Impaired glucose tolerance (oral): Secondary | ICD-10-CM | POA: Diagnosis not present

## 2023-09-02 NOTE — Progress Notes (Signed)
 Complete physical exam and Establish Care   Patient: Monique [name]   DOB: 1990-10-24   33 y.o. Female  MRN: [id number]  Subjective:    Chief Complaint  Patient presents with   Establish Care    Patient is here to establish care with a new PCP, Patient is wanting a physical for insurance through her employer,      Monique [name] is a 33 y.o. female who presents today for a complete physical exam. She reports consuming a general diet.  Walking while at work  She generally feels fairly well. She reports sleeping fairly well. She does not have additional problems to discuss today.    Most recent fall risk assessment:    09/02/2023   10:54 AM  Fall Risk   Falls in the past year? 0  Number falls in past yr: 0  Injury with Fall? 0  Risk for fall due to : No Fall Risks  Follow up Falls evaluation completed     Most recent depression screenings:    09/02/2023   10:54 AM  PHQ 2/9 Scores  PHQ - 2 Score 4  PHQ- 9 Score 8    Vision:Not within last year   Patient Active Problem List   Diagnosis Date Noted   Intrauterine pregnancy 01/07/2022   Status post repeat low transverse cesarean section 01/07/2022   Status post bilateral salpingectomy 01/07/2022   Hypokalemia 01/02/2022   Abnormal liver function tests 01/02/2022   Previous cesarean delivery affecting pregnancy, antepartum 01/02/2022   Limited prenatal care 01/02/2022   Conjunctivitis 01/02/2022   Chronic hypertension affecting pregnancy 12/31/2021   Hypertension, postpartum condition or complication 09/11/2013   GERD without esophagitis 08/15/2013   Past Medical History:  Diagnosis Date   Asthma    Hypertension    Past Surgical History:  Procedure Laterality Date   BILATERAL SALPINGECTOMY Bilateral 01/07/2022   Procedure: OPEN BILATERAL SALPINGECTOMY;  Surgeon: Adam Phenix, MD;  Location: [facility];  Service: Obstetrics;  Laterality: Bilateral;   CESAREAN SECTION N/A 09/01/2013   Procedure:  CESAREAN SECTION;  Surgeon: Antionette Char, MD;  Location: [facility];  Service: Obstetrics;  Laterality: N/A;   CESAREAN SECTION N/A 01/07/2022   Procedure: CESAREAN SECTION;  Surgeon: Adam Phenix, MD;  Location: [facility];  Service: Obstetrics;  Laterality: N/A;   Social History   Socioeconomic History   Marital status: Single    Spouse name: Not on file   Number of children: 3   Years of education: Not on file   Highest education level: Bachelor's degree (e.g., BA, AB, BS)  Occupational History   Not on file  Tobacco Use   Smoking status: Never    Passive exposure: Never   Smokeless tobacco: Never  Vaping Use   Vaping status: Never Used  Substance and Sexual Activity   Alcohol use: Yes    Comment: occ   Drug use: Never   Sexual activity: Yes    Partners: Male    Birth control/protection: Surgical  Other Topics Concern   Not on file  Social History Narrative   Not on file   Social Drivers of Health   Financial Resource Strain: Low Risk  (08/27/2023)   Overall Financial Resource Strain (CARDIA)    Difficulty of Paying Living Expenses: Not very hard  Food Insecurity: Food Insecurity Present (08/27/2023)   Hunger Vital Sign    Worried About Running Out of Food in the Last Year: Sometimes true    Ran Out  of Food in the Last Year: Sometimes true  Transportation Needs: No Transportation Needs (08/27/2023)   PRAPARE - Administrator, Civil Service (Medical): No    Lack of Transportation (Non-Medical): No  Physical Activity: Insufficiently Active (08/27/2023)   Exercise Vital Sign    Days of Exercise per Week: 3 days    Minutes of Exercise per Session: 20 min  Stress: Stress Concern Present (08/27/2023)   [employer] of Occupational Health - Occupational Stress Questionnaire    Feeling of Stress : Rather much  Social Connections: Moderately Isolated (08/27/2023)   Social Connection and Isolation Panel [NHANES]    Frequency of Communication with Friends  and Family: More than three times a week    Frequency of Social Gatherings with Friends and Family: Once a week    Attends Religious Services: 1 to 4 times per year    Active Member of [employer] or Organizations: No    Attends Engineer, structural: Not on file    Marital Status: Never married  Catering manager Violence: Not on file   Family History  Problem Relation Age of Onset   Hypertension Mother    Diabetes Mother    Hypertension Father    Diabetes Father    No Known Allergies    Patient Care Team: Patient, No Pcp Per as PCP - General (General Practice)   Outpatient Medications Prior to Visit  Medication Sig   [DISCONTINUED] acetaminophen (TYLENOL) 325 MG tablet Take 2 tablets (650 mg total) by mouth every 6 (six) hours as needed.   [DISCONTINUED] furosemide (LASIX) 20 MG tablet Take 1 tablet (20 mg total) by mouth daily for 3 days.   [DISCONTINUED] ibuprofen (ADVIL) 600 MG tablet Take 1 tablet (600 mg total) by mouth every 6 (six) hours as needed.   [DISCONTINUED] labetalol (NORMODYNE) 200 MG tablet Take 2 tablets by mouth 2 times daily.   [DISCONTINUED] NIFEdipine (ADALAT CC) 90 MG 24 hr tablet Take 1 tablet (90 mg total) by mouth daily.   [DISCONTINUED] potassium chloride SA (KLOR-CON M) 20 MEQ tablet Take 2 tablets (40 mEq total) by mouth 3 (three) times daily for 3 days.   [DISCONTINUED] Prenatal Vit-Fe Fumarate-FA (PRENATAL MULTIVITAMIN) TABS tablet Take 1 tablet by mouth daily at 12 noon.   No facility-administered medications prior to visit.    Review of Systems  All other systems reviewed and are negative.     Objective:     BP (!) 173/98   Pulse 77   Temp 98.1 F (36.7 C) (Oral)   Resp 18   Ht 5\' 4"  (1.626 m)   Wt 228 lb 4.8 oz (103.6 kg)   LMP 08/27/2023 (Exact Date)   SpO2 99%   BMI 39.19 kg/m  BP Readings from Last 3 Encounters:  09/02/23 (!) 173/98  01/15/22 122/74  01/09/22 (!) 142/83      Physical Exam Vitals and nursing note  reviewed.  Constitutional:      Appearance: Normal appearance. She is normal weight.  HENT:     Head: Normocephalic and atraumatic.     Right Ear: Tympanic membrane, ear canal and external ear normal.     Left Ear: Tympanic membrane, ear canal and external ear normal.     Nose: Nose normal.     Mouth/Throat:     Mouth: Mucous membranes are moist.     Pharynx: Oropharynx is clear.  Eyes:     Conjunctiva/sclera: Conjunctivae normal.     Pupils: Pupils are  equal, round, and reactive to light.  Cardiovascular:     Rate and Rhythm: Normal rate and regular rhythm.     Pulses: Normal pulses.     Heart sounds: Normal heart sounds.  Pulmonary:     Effort: Pulmonary effort is normal.     Breath sounds: Normal breath sounds.  Abdominal:     General: Abdomen is flat. Bowel sounds are normal.  Skin:    General: Skin is warm.     Capillary Refill: Capillary refill takes less than 2 seconds.  Neurological:     General: No focal deficit present.     Mental Status: She is alert and oriented to person, place, and time. Mental status is at baseline.  Psychiatric:        Mood and Affect: Mood normal.        Behavior: Behavior normal.        Thought Content: Thought content normal.        Judgment: Judgment normal.     No results found for any visits on 09/02/23. Last CBC Lab Results  Component Value Date   WBC 11.2 (H) 01/09/2022   HGB 9.5 (L) 01/09/2022   HCT 28.3 (L) 01/09/2022   MCV 88.7 01/09/2022   MCH 29.8 01/09/2022   RDW 18.9 (H) 01/09/2022   PLT 392 01/09/2022   Last metabolic panel Lab Results  Component Value Date   GLUCOSE 88 01/09/2022   NA 140 01/09/2022   K 3.6 01/09/2022   CL 103 01/09/2022   CO2 23 01/09/2022   BUN 8 01/09/2022   CREATININE 0.86 01/09/2022   GFRNONAA >60 01/09/2022   CALCIUM 8.1 (L) 01/09/2022   PROT 5.6 (L) 01/09/2022   ALBUMIN 2.2 (L) 01/09/2022   BILITOT 0.5 01/09/2022   ALKPHOS 113 01/09/2022   AST 45 (H) 01/09/2022   ALT 31  01/09/2022   ANIONGAP 14 01/09/2022   Last lipids No results found for: "CHOL", "HDL", "LDLCALC", "LDLDIRECT", "TRIG", "CHOLHDL" Last hemoglobin A1c Lab Results  Component Value Date   HGBA1C 5.3 12/31/2021        Assessment & Plan:    Routine Health Maintenance and Physical Exam  Immunization History  Administered Date(s) Administered   Tdap 09/02/2013    Health Maintenance  Topic Date Due   Hepatitis C Screening  Never done   Cervical Cancer Screening (HPV/Pap Cotest)  05/11/2019   INFLUENZA VACCINE  Never done   COVID-19 Vaccine (1 - 2024-25 season) Never done   DTaP/Tdap/Td (2 - Td or Tdap) 09/03/2023   HIV Screening  Completed   HPV VACCINES  Aged Out    Discussed health benefits of physical activity, and encouraged her to engage in regular exercise appropriate for her age and condition.  Problem List Items Addressed This Visit   None  No follow-ups on file. Annual physical exam  Encounter to establish care with new doctor  Encounter for lipid screening for cardiovascular disease -     Lipid panel  Impaired glucose tolerance -     CBC with Differential/Platelet -     Comprehensive metabolic panel -     Hemoglobin A1c   Screening labs See back in Pap and HTN follow up    Suzan Slick, MD

## 2023-09-03 ENCOUNTER — Encounter: Payer: Self-pay | Admitting: Family Medicine

## 2023-09-03 LAB — COMPREHENSIVE METABOLIC PANEL
ALT: 62 IU/L — ABNORMAL HIGH (ref 0–32)
AST: 17 IU/L (ref 0–40)
Albumin: 4.1 g/dL (ref 3.9–4.9)
Alkaline Phosphatase: 118 IU/L (ref 44–121)
BUN/Creatinine Ratio: 11 (ref 9–23)
BUN: 8 mg/dL (ref 6–20)
Bilirubin Total: 0.3 mg/dL (ref 0.0–1.2)
CO2: 23 mmol/L (ref 20–29)
Calcium: 8.6 mg/dL — ABNORMAL LOW (ref 8.7–10.2)
Chloride: 105 mmol/L (ref 96–106)
Creatinine, Ser: 0.7 mg/dL (ref 0.57–1.00)
Globulin, Total: 3 g/dL (ref 1.5–4.5)
Glucose: 87 mg/dL (ref 70–99)
Potassium: 4.1 mmol/L (ref 3.5–5.2)
Sodium: 141 mmol/L (ref 134–144)
Total Protein: 7.1 g/dL (ref 6.0–8.5)
eGFR: 116 mL/min/{1.73_m2} (ref >59)

## 2023-09-03 LAB — CBC WITH DIFFERENTIAL/PLATELET
Basophils Absolute: 0 10*3/uL (ref 0.0–0.2)
Basos: 1 %
EOS (ABSOLUTE): 0.1 10*3/uL (ref 0.0–0.4)
Eos: 1 %
Hematocrit: 41.7 % (ref 34.0–46.6)
Hemoglobin: 13.8 g/dL (ref 11.1–15.9)
Immature Grans (Abs): 0 10*3/uL (ref 0.0–0.1)
Immature Granulocytes: 0 %
Lymphocytes Absolute: 1.9 10*3/uL (ref 0.7–3.1)
Lymphs: 29 %
MCH: 30.6 pg (ref 26.6–33.0)
MCHC: 33.1 g/dL (ref 31.5–35.7)
MCV: 93 fL (ref 79–97)
Monocytes Absolute: 0.3 10*3/uL (ref 0.1–0.9)
Monocytes: 4 %
Neutrophils Absolute: 4.2 10*3/uL (ref 1.4–7.0)
Neutrophils: 65 %
Platelets: 252 10*3/uL (ref 150–450)
RBC: 4.51 x10E6/uL (ref 3.77–5.28)
RDW: 13.1 % (ref 11.7–15.4)
WBC: 6.5 10*3/uL (ref 3.4–10.8)

## 2023-09-03 LAB — LIPID PANEL
Chol/HDL Ratio: 4.8 ratio — ABNORMAL HIGH (ref 0.0–4.4)
Cholesterol, Total: 190 mg/dL (ref 100–199)
HDL: 40 mg/dL (ref >39)
LDL Chol Calc (NIH): 137 mg/dL — ABNORMAL HIGH (ref 0–99)
Triglycerides: 69 mg/dL (ref 0–149)
VLDL Cholesterol Cal: 13 mg/dL (ref 5–40)

## 2023-09-03 LAB — HEMOGLOBIN A1C
Est. average glucose Bld gHb Est-mCnc: 105 mg/dL
Hgb A1c MFr Bld: 5.3 % (ref 4.8–5.6)
